# Patient Record
Sex: Female | Born: 1990
Health system: Southern US, Community
[De-identification: ages and names within clinical notes are randomized; demographics above are authoritative.]

## PROBLEM LIST (undated history)

## (undated) DIAGNOSIS — T7840XA Allergy, unspecified, initial encounter: Secondary | ICD-10-CM

## (undated) HISTORY — DX: Allergy, unspecified, initial encounter: T78.40XA

---

## 2014-06-21 ENCOUNTER — Emergency Department (HOSPITAL_COMMUNITY): Payer: No Typology Code available for payment source

## 2014-06-21 ENCOUNTER — Encounter (HOSPITAL_COMMUNITY): Payer: Self-pay | Admitting: Family Medicine

## 2014-06-21 ENCOUNTER — Emergency Department (HOSPITAL_COMMUNITY)
Admission: EM | Admit: 2014-06-21 | Discharge: 2014-06-21 | Disposition: A | Discharge status: Home / Self Care | Payer: No Typology Code available for payment source | Attending: Emergency Medicine | Admitting: Emergency Medicine

## 2014-06-21 DIAGNOSIS — Z3202 Encounter for pregnancy test, result negative: Secondary | ICD-10-CM | POA: Insufficient documentation

## 2014-06-21 DIAGNOSIS — R319 Hematuria, unspecified: Secondary | ICD-10-CM | POA: Diagnosis not present

## 2014-06-21 DIAGNOSIS — R042 Hemoptysis: Secondary | ICD-10-CM | POA: Diagnosis present

## 2014-06-21 DIAGNOSIS — K922 Gastrointestinal hemorrhage, unspecified: Secondary | ICD-10-CM | POA: Insufficient documentation

## 2014-06-21 DIAGNOSIS — J40 Bronchitis, not specified as acute or chronic: Secondary | ICD-10-CM

## 2014-06-21 LAB — URINE MICROSCOPIC-ADD ON

## 2014-06-21 LAB — CBC WITH DIFFERENTIAL/PLATELET
BASOS PCT: 1 % (ref 0–1)
Basophils Absolute: 0.1 10*3/uL (ref 0.0–0.1)
EOS ABS: 0.3 10*3/uL (ref 0.0–0.7)
Eosinophils Relative: 3 % (ref 0–5)
HEMATOCRIT: 42.4 % (ref 36.0–46.0)
HEMOGLOBIN: 14.6 g/dL (ref 12.0–15.0)
Lymphocytes Relative: 19 % (ref 12–46)
Lymphs Abs: 1.9 10*3/uL (ref 0.7–4.0)
MCH: 29.9 pg (ref 26.0–34.0)
MCHC: 34.4 g/dL (ref 30.0–36.0)
MCV: 86.7 fL (ref 78.0–100.0)
MONO ABS: 1.1 10*3/uL — AB (ref 0.1–1.0)
Monocytes Relative: 11 % (ref 3–12)
NEUTROS PCT: 66 % (ref 43–77)
Neutro Abs: 6.8 10*3/uL (ref 1.7–7.7)
Platelets: 265 10*3/uL (ref 150–400)
RBC: 4.89 MIL/uL (ref 3.87–5.11)
RDW: 12.1 % (ref 11.5–15.5)
WBC: 10.1 10*3/uL (ref 4.0–10.5)

## 2014-06-21 LAB — COMPREHENSIVE METABOLIC PANEL
ALBUMIN: 4.6 g/dL (ref 3.5–5.2)
ALT: 11 U/L (ref 0–35)
AST: 24 U/L (ref 0–37)
Alkaline Phosphatase: 66 U/L (ref 39–117)
Anion gap: 8 (ref 5–15)
BILIRUBIN TOTAL: 0.6 mg/dL (ref 0.3–1.2)
BUN: 12 mg/dL (ref 6–23)
CHLORIDE: 103 mmol/L (ref 96–112)
CO2: 24 mmol/L (ref 19–32)
CREATININE: 0.54 mg/dL (ref 0.50–1.10)
Calcium: 9.8 mg/dL (ref 8.4–10.5)
GFR calc Af Amer: >90 mL/min (ref >90)
GFR calc non Af Amer: >90 mL/min (ref >90)
Glucose, Bld: 102 mg/dL — ABNORMAL HIGH (ref 70–99)
Potassium: 3.8 mmol/L (ref 3.5–5.1)
Sodium: 135 mmol/L (ref 135–145)
TOTAL PROTEIN: 8.1 g/dL (ref 6.0–8.3)

## 2014-06-21 LAB — URINALYSIS, ROUTINE W REFLEX MICROSCOPIC
BILIRUBIN URINE: NEGATIVE
Glucose, UA: NEGATIVE mg/dL
HGB URINE DIPSTICK: NEGATIVE
Ketones, ur: NEGATIVE mg/dL
Nitrite: NEGATIVE
Protein, ur: NEGATIVE mg/dL
SPECIFIC GRAVITY, URINE: 1.012 (ref 1.005–1.030)
UROBILINOGEN UA: 0.2 mg/dL (ref 0.0–1.0)
pH: 5.5 (ref 5.0–8.0)

## 2014-06-21 LAB — POC URINE PREG, ED: Preg Test, Ur: NEGATIVE

## 2014-06-21 LAB — POC OCCULT BLOOD, ED: Fecal Occult Bld: POSITIVE — AB

## 2014-06-21 MED ORDER — ACETAMINOPHEN 325 MG PO TABS
650.0000 mg | ORAL_TABLET | Freq: Once | ORAL | Status: AC
  Administered 2014-06-21: 650 mg via ORAL
  Filled 2014-06-21: qty 2

## 2014-06-21 MED ORDER — BENZONATATE 100 MG PO CAPS: 100.0000 mg | ORAL_CAPSULE | Freq: Three times a day (TID) | ORAL | Status: DC

## 2014-06-21 MED ORDER — ALBUTEROL SULFATE HFA 108 (90 BASE) MCG/ACT IN AERS
2.0000 | INHALATION_SPRAY | RESPIRATORY_TRACT | Status: DC | PRN
  Administered 2014-06-21: 2 via RESPIRATORY_TRACT
  Filled 2014-06-21: qty 6.7

## 2014-06-21 NOTE — ED Provider Notes (Signed)
MSE was initiated and I personally evaluated the patient and placed orders (if any) at  10:00 AM on June 21, 2014.  Theresa Garza is a 24 y.o. [place] female who presents to the Emergency Department complaining of cold symptoms including cough, sore throat and nasal congestion for two weeks. History limited due to language barrier, even with interpreter. Additionally patient reports generalized abdominal pain with or without eating and cold feet. Patient reports productive cough with intermittent blood tinged sputum with violent coughing bouts. Patient reports presence of bright red blood. Patient also reports blood in her stools intermittently. Patient reports chills, clear rhinorrhea, bilateral ear pain especially when going outside, and wheezing.   On exam, pt with VSS, clear lung exam, epigastric abd tenderness but no rebound/guarding.   Given the hemoptysis and bloody stools, as well as presence of abd pain, will upgrade and move for eval in pod room.   The patient appears stable so that the remainder of the MSE may be completed by another provider.  BP 121/60 mmHg  Pulse 84  Temp(Src) 99 F (37.2 C) (Oral)  Resp 18  SpO2 100%  LMP 05/21/2014 (Approximate)    Donnita FallsMercedes Strupp [place]prubi-S[place], PA-C 06/21/14 1002  Rolland PorterMark James, MD 06/29/14 1625

## 2014-06-21 NOTE — ED Notes (Signed)
Interpreter used for discharge instruction.

## 2014-06-21 NOTE — ED Notes (Addendum)
Pt c/o sore throat and blood tinged cough x2 weeks with nasal congestion. Pt also reports abdominal pain and bloody stools x2weeks Pt's primary language is [place]. Interpreter used for assessment.

## 2014-06-21 NOTE — ED Provider Notes (Signed)
CSN: [id number]     Arrival date & time 06/21/14  2841 History   First MD Initiated Contact with Patient 06/21/14 [phone]     Chief Complaint  Patient presents with  . Hemoptysis  . Blood In Stools     (Consider location/radiation/quality/duration/timing/severity/associated sxs/prior Treatment) HPI Comments: Patient with no significant past medical history presents with main complaint of cough 2 weeks. Cough is productive of sputum. Patient denies blood in the sputum to me (contradictory to nursing note/initial PA note) but also states that she has bright red blood in her stool and blood in her urine on the tissue paper at times. She compains of having a fever/chills but has not taken her temperature. She otherwise does not have ear pain or nasal congestion. Patient has a sore throat and pain in her chest but only when she is coughing. She has had epigastric pain that is constant for several days without nausea and vomiting. She does report eating a lot of spicy and sour foods. Last period was one month ago and was normal for the patient. She denies dysuria, increased frequency. No treatments prior to arrival. Denies history of hemorrhoids, rectal pain or swelling, pain with defecation. The onset of this condition was acute. The course is constant. Aggravating factors: none. Alleviating factors: none. No sick contacts. No recent travel in last 3 months.    The history is provided by the patient.    History reviewed. No pertinent past medical history. History reviewed. No pertinent past surgical history. No family history on file. History  Substance Use Topics  . Smoking status: Not on file  . Smokeless tobacco: Not on file  . Alcohol Use: Not on file   OB History    No data available     Review of Systems  Constitutional: Positive for fever and chills. Negative for fatigue.  HENT: Positive for sore throat. Negative for congestion, ear pain, rhinorrhea and sinus pressure.   Eyes:  Negative for redness.  Respiratory: Positive for cough and chest tightness (with cough). Negative for shortness of breath and wheezing.   Cardiovascular: Negative for leg swelling.  Gastrointestinal: Positive for abdominal pain (epigastric) and blood in stool. Negative for nausea, vomiting and diarrhea.  Genitourinary: Positive for hematuria. Negative for dysuria.  Musculoskeletal: Negative for myalgias and neck stiffness.  Skin: Negative for rash.  Neurological: Positive for headaches.  Hematological: Negative for adenopathy.    Allergies  Review of patient's allergies indicates no known allergies.  Home Medications   Prior to Admission medications   Not on File   BP 121/60 mmHg  Pulse 84  Temp(Src) 99 F (37.2 C) (Oral)  Resp 18  SpO2 100%  LMP 05/21/2014 (Approximate)   Physical Exam  Constitutional: She appears well-developed and well-nourished.  HENT:  Head: Normocephalic and atraumatic.  Right Ear: Tympanic membrane, external ear and ear canal normal.  Left Ear: Tympanic membrane, external ear and ear canal normal.  Nose: Nose normal. No mucosal edema or rhinorrhea.  Mouth/Throat: Uvula is midline, oropharynx is clear and moist and mucous membranes are normal. Mucous membranes are not dry. No oral lesions. No trismus in the jaw. No uvula swelling. No oropharyngeal exudate, posterior oropharyngeal edema, posterior oropharyngeal erythema or tonsillar abscesses.  Eyes: Conjunctivae are normal. Right eye exhibits no discharge. Left eye exhibits no discharge.  Neck: Normal range of motion. Neck supple.  Cardiovascular: Normal rate, regular rhythm and normal heart sounds.   No murmur heard. Pulmonary/Chest: Effort normal and breath sounds  normal. No respiratory distress. She has no wheezes. She has no rales.  Coughing during exam  Abdominal: Soft. Bowel sounds are normal. She exhibits no distension. There is tenderness (mild, epigastric). There is no rebound and no guarding.   Musculoskeletal: She exhibits no edema or tenderness.  Lymphadenopathy:    She has no cervical adenopathy.  Neurological: She is alert.  Skin: Skin is warm and dry.  Psychiatric: She has a normal mood and affect.  Nursing note and vitals reviewed.   ED Course  Procedures (including critical care time) Labs Review Labs Reviewed  CBC WITH DIFFERENTIAL/PLATELET - Abnormal; Notable for the following:    Monocytes Absolute 1.1 (*)    All other components within normal limits  COMPREHENSIVE METABOLIC PANEL - Abnormal; Notable for the following:    Glucose, Bld 102 (*)    All other components within normal limits  URINALYSIS, ROUTINE W REFLEX MICROSCOPIC - Abnormal; Notable for the following:    Leukocytes, UA TRACE (*)    All other components within normal limits  URINE MICROSCOPIC-ADD ON - Abnormal; Notable for the following:    Squamous Epithelial / LPF FEW (*)    All other components within normal limits  POC URINE PREG, ED  POC OCCULT BLOOD, ED    Imaging Review Dg Chest 2 View  06/21/2014   CLINICAL DATA:  Cough for 2 weeks.  EXAM: CHEST  2 VIEW  COMPARISON:  None.  FINDINGS: The heart size and mediastinal contours are within normal limits. Both lungs are clear. The visualized skeletal structures are unremarkable.  IMPRESSION: Normal chest.   Electronically Signed   By: Francene BoyersJames  Maxwell M.D.   On: 06/21/2014 12:56     EKG Interpretation None       10:53 AM Patient seen and examined. Work-up initiated. Medications ordered.   Vital signs reviewed and are as follows: BP 121/60 mmHg  Pulse 84  Temp(Src) 99 F (37.2 C) (Oral)  Resp 18  SpO2 100%  LMP 05/21/2014 (Approximate)  2:34 PM Labs and x-ray results discussed with patient. Patient's Hemoccult was positive.  Exam remains stable, patient does not have any abdominal tenderness and her abdomen is soft.  Regarding cough, will discharge to home with albuterol inhaler and Tessalon.  Regarding suspected lower GI  bleeding, GI follow-up given. Patient encouraged to increase fiber diet.  The patient was urged to return to the Emergency Department immediately with worsening of current symptoms, worsening abdominal pain, persistent vomiting, increasing amount of blood noted in stools, fever, or any other concerns. The patient verbalized understanding.   BP 99/58 mmHg  Pulse 69  Temp(Src) 99 F (37.2 C) (Oral)  Resp 16  SpO2 100%  LMP 05/21/2014 (Approximate)    MDM   Final diagnoses:  Bronchitis  Lower GI bleeding   Bronchitis: Patient with likely viral bronchitis. No concern for PNA given normal lung exam/x-ray. Antibiotics not indicated. Conservative therapy indicated.    Lower GI bleeding: unclear etiology. Lower GI bleeding suspected given passage of heme positive stool, bright red blood per rectum, presentation not consistent with a large upper GI bleed.  The following differential diagnoses were considered for this patient's lower GI bleed: Hemorrhoids  *Anal fissure *Colonic polyp *Proctitis *IBD *Infectious diarrhea *Diverticulosis *Mesenteric ischemia *Colon cancer *Arteriovenous malformation  None of the following red flags identified or suspected: *Abnormal vital signs *Symptoms suggestive of malignancy such as constitutional symptoms (fever, weight loss), anemia, or change in frequency, caliber or consistency of stools * Family history  of colon cancer  The patient appears reasonably screened and/or stabilized for discharge and I doubt any other medical condition or other emergency medical conditions requiring further screening, evaluation, or treatment in the ED at this time prior to discharge.  Follow-up: Age less than 40, source of bleeding not identified -- unlikely to be colon cancer or other serious etiology. GI referral given for further evaluation, possible sigmoidoscopy.          Renne Crigler, PA-C 06/21/14 1437  Rolland Porter, MD 06/29/14 [phone]

## 2014-06-21 NOTE — Discharge Instructions (Signed)
Please read and follow all provided instructions.  Your diagnoses today include:  1. Bronchitis   2. Lower GI bleeding     Tests performed today include:  Chest x-ray - does not show any pneumonia  Blood counts and electrolytes - normal blood counts, no sign of infection  Urine test - no blood, no infection  Stool test - positive for blood  Vital signs. See below for your results today.   Medications prescribed:   Albuterol inhaler - medication that opens up your airway  Use inhaler as follows: 1-2 puffs with spacer every 4 hours as needed for wheezing, cough, or shortness of breath.    Tessalon Perles - cough suppressant medication  Take any prescribed medications only as directed.  Home care instructions:  Follow any educational materials contained in this packet.  Follow-up instructions: Please follow-up with your primary care provider in the next 3 days for further evaluation of your symptoms and a recheck if you are not feeling better.   Please follow-up with the stomach doctor listed for further evaluation of blood in your stools. You can increase the fiber in your diet to see if this helps.   Return instructions:   Please return to the Emergency Department if you experience worsening symptoms.  Please return with worsening wheezing, shortness of breath, or difficulty breathing.  Return with persistent fever above 101F.   Return if you develop abdominal pain or have a larger amount of blood in your stool.   Please return if you have any other emergent concerns.  Additional Information:  Your vital signs today were: BP 99/58 mmHg   Pulse 69   Temp(Src) 99 F (37.2 C) (Oral)   Resp 16   SpO2 100%   LMP 05/21/2014 (Approximate) If your blood pressure (BP) was elevated above 135/85 this visit, please have this repeated by your doctor within one month. --------------

## 2014-09-14 ENCOUNTER — Emergency Department (HOSPITAL_COMMUNITY)
Admission: EM | Admit: 2014-09-14 | Discharge: 2014-09-14 | Disposition: A | Discharge status: Home / Self Care | Payer: PRIVATE HEALTH INSURANCE | Attending: Emergency Medicine | Admitting: Emergency Medicine

## 2014-09-14 ENCOUNTER — Emergency Department (HOSPITAL_COMMUNITY): Payer: PRIVATE HEALTH INSURANCE

## 2014-09-14 ENCOUNTER — Encounter (HOSPITAL_COMMUNITY): Payer: Self-pay | Admitting: *Deleted

## 2014-09-14 DIAGNOSIS — R05 Cough: Secondary | ICD-10-CM | POA: Insufficient documentation

## 2014-09-14 DIAGNOSIS — K625 Hemorrhage of anus and rectum: Secondary | ICD-10-CM

## 2014-09-14 DIAGNOSIS — R1013 Epigastric pain: Secondary | ICD-10-CM | POA: Insufficient documentation

## 2014-09-14 DIAGNOSIS — Z79899 Other long term (current) drug therapy: Secondary | ICD-10-CM | POA: Diagnosis not present

## 2014-09-14 DIAGNOSIS — R079 Chest pain, unspecified: Secondary | ICD-10-CM | POA: Insufficient documentation

## 2014-09-14 DIAGNOSIS — Z3202 Encounter for pregnancy test, result negative: Secondary | ICD-10-CM | POA: Insufficient documentation

## 2014-09-14 DIAGNOSIS — R053 Chronic cough: Secondary | ICD-10-CM

## 2014-09-14 LAB — URINALYSIS, ROUTINE W REFLEX MICROSCOPIC
Bilirubin Urine: NEGATIVE
Glucose, UA: NEGATIVE mg/dL
Hgb urine dipstick: NEGATIVE
Ketones, ur: NEGATIVE mg/dL
LEUKOCYTES UA: NEGATIVE
Nitrite: NEGATIVE
PH: 7 (ref 5.0–8.0)
Protein, ur: NEGATIVE mg/dL
SPECIFIC GRAVITY, URINE: 1.01 (ref 1.005–1.030)
Urobilinogen, UA: 0.2 mg/dL (ref 0.0–1.0)

## 2014-09-14 LAB — PROTIME-INR
INR: 1.04 (ref 0.00–1.49)
Prothrombin Time: 13.7 seconds (ref 11.6–15.2)

## 2014-09-14 LAB — CBC
HCT: 41.1 % (ref 36.0–46.0)
Hemoglobin: 13.7 g/dL (ref 12.0–15.0)
MCH: 29.5 pg (ref 26.0–34.0)
MCHC: 33.3 g/dL (ref 30.0–36.0)
MCV: 88.4 fL (ref 78.0–100.0)
PLATELETS: 360 10*3/uL (ref 150–400)
RBC: 4.65 MIL/uL (ref 3.87–5.11)
RDW: 12.1 % (ref 11.5–15.5)
WBC: 11.7 10*3/uL — AB (ref 4.0–10.5)

## 2014-09-14 LAB — BASIC METABOLIC PANEL
Anion gap: 10 (ref 5–15)
BUN: 7 mg/dL (ref 6–20)
CHLORIDE: 104 mmol/L (ref 101–111)
CO2: 23 mmol/L (ref 22–32)
Calcium: 9.8 mg/dL (ref 8.9–10.3)
Creatinine, Ser: 0.54 mg/dL (ref 0.44–1.00)
GFR calc non Af Amer: >60 mL/min (ref >60)
GLUCOSE: 87 mg/dL (ref 70–99)
Potassium: 3.9 mmol/L (ref 3.5–5.1)
Sodium: 137 mmol/L (ref 135–145)

## 2014-09-14 LAB — HEPATIC FUNCTION PANEL
ALBUMIN: 4.3 g/dL (ref 3.5–5.0)
ALK PHOS: 64 U/L (ref 38–126)
ALT: 12 U/L — ABNORMAL LOW (ref 14–54)
AST: 18 U/L (ref 15–41)
BILIRUBIN TOTAL: 0.5 mg/dL (ref 0.3–1.2)
Total Protein: 7.8 g/dL (ref 6.5–8.1)

## 2014-09-14 LAB — D-DIMER, QUANTITATIVE: D-Dimer, Quant: <0.27 ug/mL-FEU (ref 0.00–0.48)

## 2014-09-14 LAB — POC URINE PREG, ED: Preg Test, Ur: NEGATIVE

## 2014-09-14 LAB — POC OCCULT BLOOD, ED: FECAL OCCULT BLD: POSITIVE — AB

## 2014-09-14 NOTE — ED Notes (Signed)
MD at bedside. 

## 2014-09-14 NOTE — Discharge Instructions (Signed)
Bloody Stools Bloody stools means there is blood in your poop (stool). It is a sign that there is a problem somewhere in the digestive system. It is important for your doctor to find the cause of your bleeding, so the problem can be treated.  HOME CARE  Only take medicine as told by your doctor.  Eat foods with fiber (prunes, bran cereals).  Drink enough fluids to keep your pee (urine) clear or pale yellow.  Sit in warm water (sitz bath) for 10 to 15 minutes as told by your doctor.  Know how to take your medicines (enemas, suppositories) if advised by your doctor.  Watch for signs that you are getting better or getting worse. GET HELP RIGHT AWAY IF:   You are not getting better.  You start to get better but then get worse again.  You have new problems.  You have severe bleeding from the place where poop comes out (rectum) that does not stop.  You throw up (vomit) blood.  You feel weak or pass out (faint).  You have a fever. MAKE SURE YOU:   Understand these instructions.  Will watch your condition.  Will get help right away if you are not doing well or get worse. Document Released: 04/17/2009 Document Revised: 07/22/2011 Document Reviewed: 09/14/2010 [facility] Patient Information 2015 [place], [place]. This information is not intended to replace advice given to you by your health care provider. Make sure you discuss any questions you have with your health care provider. Hemoptysis Hemoptysis, which means coughing up blood, can be a sign of a minor problem or a serious medical condition. The blood that is coughed up may come from the lungs and airways. Coughed-up blood can also come from bleeding that occurs outside the lungs and airways. Blood can drain into the windpipe during a severe nosebleed or when blood is vomited from the stomach. Because hemoptysis can be a sign of something serious, a medical evaluation is required. For some people with hemoptysis, no definite cause is  ever identified. CAUSES  The most common cause of hemoptysis is bronchitis. Some other common causes include:   A ruptured blood vessel caused by coughing or an infection.   A medical condition that causes damage to the large air passageways (bronchiectasis).   A blood clot in the lungs (pulmonary embolism).   Pneumonia.   Tuberculosis.   Breathing in a small foreign object.   Cancer. For some people with hemoptysis, no definite cause is ever identified.  HOME CARE INSTRUCTIONS  Only take over-the-counter or prescription medicines as directed by your caregiver. Do not use cough suppressants unless your caregiver approves.  If your caregiver prescribes antibiotic medicines, take them as directed. Finish them even if you start to feel better.  Do not smoke. Also avoid secondhand smoke.  Follow up with your caregiver as directed. SEEK IMMEDIATE MEDICAL CARE IF:   You cough up bloody mucus for longer than a week.  You have a blood-producing cough that is severe or getting worse.  You have a blood-producing cough thatcomes and goes over time.  You develop problems with your breathing.   You vomit blood.  You develop bloody or black-colored stools.  You have chest pain.   You develop night sweats.  You feel faint or pass out.   You have a fever or persistent symptoms for more than 2-3 days.  You have a fever and your symptoms suddenly get worse. MAKE SURE YOU:  Understand these instructions.  Will watch  your condition.  Will get help right away if you are not doing well or get worse. Document Released: 07/08/2001 Document Revised: 04/15/2012 Document Reviewed: 02/14/2012 [facility] Patient Information 2015 [place], [place]. This information is not intended to replace advice given to you by your health care provider. Make sure you discuss any questions you have with your health care provider. Chest Pain (Nonspecific) It is often hard to give a specific  diagnosis for the cause of chest pain. There is always a chance that your pain could be related to something serious, such as a heart attack or a blood clot in the lungs. You need to follow up with your health care provider for further evaluation. CAUSES   Heartburn.  Pneumonia or bronchitis.  Anxiety or stress.  Inflammation around your heart (pericarditis) or lung (pleuritis or pleurisy).  A blood clot in the lung.  A collapsed lung (pneumothorax). It can develop suddenly on its own (spontaneous pneumothorax) or from trauma to the chest.  Shingles infection (herpes zoster virus). The chest wall is composed of bones, muscles, and cartilage. Any of these can be the source of the pain.  The bones can be bruised by injury.  The muscles or cartilage can be strained by coughing or overwork.  The cartilage can be affected by inflammation and become sore (costochondritis). DIAGNOSIS  Lab tests or other studies may be needed to find the cause of your pain. Your health care provider may have you take a test called an ambulatory electrocardiogram (ECG). An ECG records your heartbeat patterns over a 24-hour period. You may also have other tests, such as:  Transthoracic echocardiogram (TTE). During echocardiography, sound waves are used to evaluate how blood flows through your heart.  Transesophageal echocardiogram (TEE).  Cardiac monitoring. This allows your health care provider to monitor your heart rate and rhythm in real time.  Holter monitor. This is a portable device that records your heartbeat and can help diagnose heart arrhythmias. It allows your health care provider to track your heart activity for several days, if needed.  Stress tests by exercise or by giving medicine that makes the heart beat faster. TREATMENT   Treatment depends on what may be causing your chest pain. Treatment may include:  Acid blockers for heartburn.  Anti-inflammatory medicine.  Pain medicine for  inflammatory conditions.  Antibiotics if an infection is present.  You may be advised to change lifestyle habits. This includes stopping smoking and avoiding alcohol, caffeine, and chocolate.  You may be advised to keep your head raised (elevated) when sleeping. This reduces the chance of acid going backward from your stomach into your esophagus. Most of the time, nonspecific chest pain will improve within 2-3 days with rest and mild pain medicine.  HOME CARE INSTRUCTIONS   If antibiotics were prescribed, take them as directed. Finish them even if you start to feel better.  For the next few days, avoid physical activities that bring on chest pain. Continue physical activities as directed.  Do not use any tobacco products, including cigarettes, chewing tobacco, or electronic cigarettes.  Avoid drinking alcohol.  Only take medicine as directed by your health care provider.  Follow your health care provider's suggestions for further testing if your chest pain does not go away.  Keep any follow-up appointments you made. If you do not go to an appointment, you could develop lasting (chronic) problems with pain. If there is any problem keeping an appointment, call to reschedule. SEEK MEDICAL CARE IF:   Your chest  pain does not go away, even after treatment.  You have a rash with blisters on your chest.  You have a fever. SEEK IMMEDIATE MEDICAL CARE IF:   You have increased chest pain or pain that spreads to your arm, neck, jaw, back, or abdomen.  You have shortness of breath.  You have an increasing cough, or you cough up blood.  You have severe back or abdominal pain.  You feel nauseous or vomit.  You have severe weakness.  You faint.  You have chills. This is an emergency. Do not wait to see if the pain will go away. Get medical help at once. Call your local emergency services (911 in [place] Do not drive yourself to the hospital. MAKE SURE YOU:   Understand these  instructions.  Will watch your condition.  Will get help right away if you are not doing well or get worse. Document Released: 02/06/2005 Document Revised: 05/04/2013 Document Reviewed: 12/03/2007 [facility] Patient Information 2015 [place], [place]. This information is not intended to replace advice given to you by your health care provider. Make sure you discuss any questions you have with your health care provider.

## 2014-09-14 NOTE — ED Notes (Signed)
The pt is here for several complaints.  Swelling on the outside of her throat stuffy nose. Nausea.  She also has had some rectal  Problem  Maybe blood.  She wants a colonoscopy    There is a language barrier.  lmpmaybe last month.  She has been seen here for other things

## 2014-09-14 NOTE — ED Notes (Signed)
Patient transported to X-ray 

## 2014-09-14 NOTE — ED Provider Notes (Signed)
History is obtained through medical interpreter using [place] language line. Patient complains of difficulty breathing through her nose and nose stuffiness intermittently for several weeks. No other complaint at present. She is alert no distress lungs clear auscultation heart regular rate and rhythm abdomen nondistended nontender.  Doug SouSam Kyndall Amero, MD 09/14/14 2052

## 2014-09-14 NOTE — ED Provider Notes (Signed)
CSN: [id number]     Arrival date & time 09/14/14  1457 History   First MD Initiated Contact with Patient 09/14/14 1632     Chief Complaint  Patient presents with  . URI  . Rectal Bleeding     (Consider location/radiation/quality/duration/timing/severity/associated sxs/prior Treatment) HPI   This is a 24 year old female, originally from [place], 13 [place] status [place] into [place], presenting today with a host of complaints, including cough, hemoptysis, hematochezia. This is been going on for 4 months. She has had mild, intermittent, blood from her rectum. She is not taking any new medicines for this. She also endorses epigastric abdominal pain, mild, nonradiating, without nausea, vomiting. Positive for months of intermittent left-sided sharp chest pain, which she does not endorse at this time. She was seen in this department 3 months ago for this, recommended that she follow up with primary care physician, gastroenterology. She is neglected to do both of these things.  History reviewed. No pertinent past medical history. History reviewed. No pertinent past surgical history. No family history on file. History  Substance Use Topics  . Smoking status: Never Smoker   . Smokeless tobacco: Not on file  . Alcohol Use: No   OB History    No data available     Review of Systems  Constitutional: Negative for fever and chills.  HENT: Negative for facial swelling.   Eyes: Negative for photophobia and pain.  Respiratory: Negative for cough and shortness of breath.   Cardiovascular: Positive for chest pain. Negative for leg swelling.  Gastrointestinal: Positive for abdominal pain and blood in stool. Negative for nausea and vomiting.  Genitourinary: Positive for hematuria. Negative for dysuria.  Musculoskeletal: Negative for arthralgias.  Skin: Negative for rash and wound.  Neurological: Negative for seizures.  Hematological: Negative for adenopathy.      Allergies   Review of patient's allergies indicates no known allergies.  Home Medications   Prior to Admission medications   Medication Sig Start Date End Date Taking? Authorizing Provider  Ascorbic Acid (VITAMIN C PO) Take 1 tablet by mouth daily.   Yes Historical Provider, MD  benzonatate (TESSALON) 100 MG capsule Take 1 capsule (100 mg total) by mouth every 8 (eight) hours. 06/21/14   Renne CriglerJoshua Geiple, PA-C   BP 97/62 mmHg  Pulse 78  Temp(Src) 98.7 F (37.1 C)  Resp 13  Wt 111 lb (50.349 kg)  SpO2 100%  LMP 08/15/2014 Physical Exam  Constitutional: She is oriented to person, place, and time. She appears well-developed and well-nourished. No distress.  HENT:  Head: Normocephalic and atraumatic.  Mouth/Throat: No oropharyngeal exudate.  Eyes: Conjunctivae are normal. Pupils are equal, round, and reactive to light. No scleral icterus.  Neck: Normal range of motion. No tracheal deviation present. No thyromegaly present.  Cardiovascular: Normal rate, regular rhythm and normal heart sounds.  Exam reveals no gallop and no friction rub.   No murmur heard. Pulmonary/Chest: Effort normal and breath sounds normal. No stridor. No respiratory distress. She has no wheezes. She has no rales. She exhibits no tenderness.  Abdominal: Soft. She exhibits no distension and no mass. There is no tenderness. There is no rebound and no guarding.  Genitourinary: Rectal exam shows no external hemorrhoid, no internal hemorrhoid, no fissure, no mass, no tenderness and anal tone normal. Guaiac positive stool.  Musculoskeletal: Normal range of motion. She exhibits no edema.  Neurological: She is alert and oriented to person, place, and time.  Skin: Skin is warm and dry. She  is not diaphoretic.    ED Course  Procedures (including critical care time) Labs Review Labs Reviewed  CBC - Abnormal; Notable for the following:    WBC 11.7 (*)    All other components within normal limits  HEPATIC FUNCTION PANEL - Abnormal;  Notable for the following:    ALT 12 (*)    Bilirubin, Direct <0.1 (*)    All other components within normal limits  POC OCCULT BLOOD, ED - Abnormal; Notable for the following:    Fecal Occult Bld POSITIVE (*)    All other components within normal limits  BASIC METABOLIC PANEL  PROTIME-INR  D-DIMER, QUANTITATIVE  URINALYSIS, ROUTINE W REFLEX MICROSCOPIC  POC URINE PREG, ED    Imaging Review Dg Chest 2 View  09/14/2014   CLINICAL DATA:  Upper respiratory infection, rectal bleeding  EXAM: CHEST  2 VIEW  COMPARISON:  06/21/2014  FINDINGS: Cardiomediastinal silhouette is stable. No acute infiltrate or pleural effusion. No pulmonary edema. Bony thorax is unremarkable.  IMPRESSION: No active cardiopulmonary disease.   Electronically Signed   By: Natasha MeadLiviu  Pop M.D.   On: 09/14/2014 17:21     EKG Interpretation   Date/Time:  Wednesday Sep 14 2014 17:32:05 EDT Ventricular Rate:  79 PR Interval:  135 QRS Duration: 74 QT Interval:  381 QTC Calculation: 437 R Axis:   76 Text Interpretation:  Sinus rhythm Early repolarization (normal variant)  No old tracing to compare Confirmed by Ethelda ChickJACUBOWITZ  MD, SAM [phone]([id number]) on  09/14/2014 5:40:56 PM      MDM   Final diagnoses:  Rectal bleeding  Chronic cough    This is a 24 year old female, originally from [place], 13 [place] status [place] immigration into [place], presenting today with a host of complaints, including cough, hemoptysis, hematochezia. This is been going on for 4 months. She has had mild, intermittent, blood from her rectum. She is not taking any new medicines for this. She also endorses epigastric abdominal pain, mild, nonradiating, without nausea, vomiting. Positive for months of intermittent left-sided sharp chest pain, which she does not endorse at this time. She was seen in this department 3 months ago for this, recommended that she follow up with primary care physician, gastroenterology. She is neglected to do both of these  things.  On my examination, cardiovascular, respiratory exams are within normal limits. Negative for conjunctival pallor. Capillary refill is brisk. Negative for focal neurologic deficits. Negative for signs or symptoms of anemia.  D-dimer is undetectable. I do not suspect pulmonary meals and at this time. EKG is without ischemic changes. Chest x-ray is without acute abnormalities. Hemoglobin is stable.  Blatchford risk stratification pool has score of 1.  The patient is suffering from gastrointestinal bleed, she is at very low risk of short-term adverse outcomes.  Patient remains stable at this time.  Pt stable for discharge, FU.  All questions answered.  Return precautions given.  I have discussed case and care has been guided by my attending physician, Dr. Ethelda ChickJacubowitz.  Loma BostonStirling Grey Rakestraw, MD 09/14/14 2049  Doug SouSam Jacubowitz, MD 09/15/14 [id number]

## 2014-09-19 ENCOUNTER — Ambulatory Visit: Payer: PRIVATE HEALTH INSURANCE

## 2014-10-05 ENCOUNTER — Ambulatory Visit (INDEPENDENT_AMBULATORY_CARE_PROVIDER_SITE_OTHER): Payer: PRIVATE HEALTH INSURANCE | Admitting: Family Medicine

## 2014-10-05 VITALS — BP 100/68 | HR 78 | Temp 98.0°F | Resp 18 | Ht <= 58 in | Wt 108.8 lb

## 2014-10-05 DIAGNOSIS — K219 Gastro-esophageal reflux disease without esophagitis: Secondary | ICD-10-CM | POA: Diagnosis not present

## 2014-10-05 DIAGNOSIS — R05 Cough: Secondary | ICD-10-CM

## 2014-10-05 DIAGNOSIS — R059 Cough, unspecified: Secondary | ICD-10-CM

## 2014-10-05 DIAGNOSIS — K5900 Constipation, unspecified: Secondary | ICD-10-CM | POA: Diagnosis not present

## 2014-10-05 DIAGNOSIS — K649 Unspecified hemorrhoids: Secondary | ICD-10-CM | POA: Diagnosis not present

## 2014-10-05 MED ORDER — POLYETHYLENE GLYCOL 3350 17 GM/SCOOP PO POWD: 17.0000 g | Freq: Two times a day (BID) | ORAL | Status: DC | PRN

## 2014-10-05 MED ORDER — OMEPRAZOLE 40 MG PO CPDR: 40.0000 mg | DELAYED_RELEASE_CAPSULE | Freq: Every day | ORAL | Status: DC

## 2014-10-05 NOTE — Progress Notes (Signed)
Chief Complaint:  Chief Complaint  Patient presents with  . Follow-up    post hospital visit for bloody stool, and cough  . Chest Pain    HPI: Theresa Garza is a 24 y.o. female who is here for recheck: 1. Cough, excessive cough makes her have some hemoptysis.Chest xray was negative for any active cardiopulm diseases including TB.  She is currently on zpack for this. Denies TB like sxs ie weight loss that is untintentional, she did take a weight loss supplement about 1 month ago. BUt has since stopped. Deneis palpitations or dizziness or SOB 2. She was seen in ER for all these sxs including BRBPR, when she wipes. She has no family hx of colon cancer. She eats spicy foods. She has constipation, She has a history of hemorrhoids. IN ER she had + hemocult.  She has not seen GI 3. She has GERD like sxs, chest pain which is midepigastric and goes up her esophagus, she eats spicy foods and lots of sour substances. She doe snto drink caffeine.    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 No pertinent past medical history. History reviewed. No pertinent past surgical history. History   Social History  . Marital Status: Single    Spouse Name: N/A  . Number of Children: N/A  . Years of Education: N/A   Social History Main Topics  . Smoking status: Never Smoker   . Smokeless tobacco: Not on file  . Alcohol Use: No  . Drug Use: Not on file  . Sexual Activity: Not on file   Other Topics Concern  . None   Social History Narrative   History reviewed. No pertinent family history. No Known Allergies Prior to Admission medications   Medication Sig Start Date End Date Taking? Authorizing Provider  azithromycin (ZITHROMAX) 250 MG tablet Take by mouth daily.   Yes Historical Provider, MD  Ascorbic Acid (VITAMIN C PO) Take 1 tablet by  mouth daily.    Historical Provider, MD  benzonatate (TESSALON) 100 MG capsule Take 1 capsule (100 mg total) by mouth every 8 (eight) hours. Patient not taking: Reported on 10/05/2014 06/21/14   Renne Crigler, PA-C     ROS: The patient denies fevers, chills, night sweats, unintentional weight loss, palpitations, wheezing, dyspnea on exertion, nausea, vomiting, abdominal pain, dysuria, hematuria, melena, numbness, weakness, or tingling.   All other systems have been reviewed and were otherwise negative with the exception of those mentioned in the HPI and as above.    PHYSICAL EXAM: Filed Vitals:   10/05/14 1746  BP: 100/68  Pulse: 78  Temp: 98 F (36.7 C)  Resp: 18   Filed Vitals:   10/05/14 1746  Height:  (1.473 m)  Weight: 108 lb 12.8 oz (49.351 kg)   Body mass index is 22.75 kg/(m^2).  General: Alert, no acute distress HEENT:  Normocephalic, atraumatic, oropharynx patent. EOMI, PERRLA, TM normal, neg exudates. No thryoid megaly Cardiovascular:  Regular rate and rhythm, no rubs murmurs or gallops.  No Carotid bruits, radial pulse intact. No pedal edema.  Respiratory: Clear to auscultation bilaterally.  No wheezes, rales, or rhonchi.  No cyanosis, no use of accessory musculature GI: No organomegaly, abdomen is soft and non-tender, positive bowel sounds.  No masses. Skin: No rashes. Neurologic: Facial musculature  symmetric. CN 2-12 grossly normal Psychiatric: Patient is appropriate throughout our interaction. Lymphatic: No cervical lymphadenopathy Musculoskeletal: Gait intact. + hemorrhoid external, non internal    LABS: Results for orders placed or performed during the hospital encounter of 09/14/14  Basic metabolic panel  Result Value Ref Range   Sodium 137 135 - 145 mmol/L   Potassium 3.9 3.5 - 5.1 mmol/L   Chloride 104 101 - 111 mmol/L   CO2 23 22 - 32 mmol/L   Glucose, Bld 87 70 - 99 mg/dL   BUN 7 6 - 20 mg/dL   Creatinine, Ser 1.610.54 0.44 - 1.00 mg/dL   Calcium  9.8 8.9 - 09.610.3 mg/dL   GFR calc non Af Amer >60 >60 mL/min   GFR calc Af Amer >60 >60 mL/min   Anion gap 10 5 - 15  CBC  Result Value Ref Range   WBC 11.7 (H) 4.0 - 10.5 K/uL   RBC 4.65 3.87 - 5.11 MIL/uL   Hemoglobin 13.7 12.0 - 15.0 g/dL   HCT 04.541.1 40.936.0 - 81.146.0 %   MCV 88.4 78.0 - 100.0 fL   MCH 29.5 26.0 - 34.0 pg   MCHC 33.3 30.0 - 36.0 g/dL   RDW 91.412.1 78.211.5 - 95.615.5 %   Platelets 360 150 - 400 K/uL  Urinalysis, Routine w reflex microscopic  Result Value Ref Range   Color, Urine YELLOW YELLOW   APPearance CLEAR CLEAR   Specific Gravity, Urine 1.010 1.005 - 1.030   pH 7.0 5.0 - 8.0   Glucose, UA NEGATIVE NEGATIVE mg/dL   Hgb urine dipstick NEGATIVE NEGATIVE   Bilirubin Urine NEGATIVE NEGATIVE   Ketones, ur NEGATIVE NEGATIVE mg/dL   Protein, ur NEGATIVE NEGATIVE mg/dL   Urobilinogen, UA 0.2 0.0 - 1.0 mg/dL   Nitrite NEGATIVE NEGATIVE   Leukocytes, UA NEGATIVE NEGATIVE  Protime-INR  Result Value Ref Range   Prothrombin Time 13.7 11.6 - 15.2 seconds   INR 1.04 0.00 - 1.49  Hepatic function panel  Result Value Ref Range   Total Protein 7.8 6.5 - 8.1 g/dL   Albumin 4.3 3.5 - 5.0 g/dL   AST 18 15 - 41 U/L   ALT 12 (L) 14 - 54 U/L   Alkaline Phosphatase 64 38 - 126 U/L   Total Bilirubin 0.5 0.3 - 1.2 mg/dL   Bilirubin, Direct <2.1<0.1 (L) 0.1 - 0.5 mg/dL   Indirect Bilirubin NOT CALCULATED 0.3 - 0.9 mg/dL  D-dimer, quantitative  Result Value Ref Range   D-Dimer, Quant <0.27 0.00 - 0.48 ug/mL-FEU  POC occult blood, ED  Result Value Ref Range   Fecal Occult Bld POSITIVE (A) NEGATIVE  POC urine preg, ED ([facility])  Result Value Ref Range   Preg Test, Ur NEGATIVE NEGATIVE     EKG/XRAY:   [facility]   ASSESSMENT/PLAN: Encounter Diagnoses  Name Primary?  . Gastroesophageal reflux disease, esophagitis presence not specified Yes  . Hemorrhoids, unspecified hemorrhoid type   . Cough   . Constipation, unspecified constipation type    24  y/o [place] female who is over here to learn English and go to school, she has a PMH of GERD like sxs and constipation  Gave detailed instructions on GERD prescautions, protonix rx, decrease to 20 mg daily from 40  When sxs lessen Cont with z pack that another urgent care had given her for URI sxs which I think is either allergies or GERD related OPTc miralax and also  nasacort, if her constipation resolveds consider Zyrtec if allergie spersists IF blood cont even after miralax then need GI referral She is ok with plan today.  Fu as needed  More than 30 min face to face time spent with patient    Gross sideeffects, risk and benefits, and alternatives of medications d/w patient. Patient is aware that all medications have potential sideeffects and we are unable to predict every sideeffect or drug-drug interaction that may occur.  Hamilton Capri PHUONG, DO 10/07/2014 12:46 PM

## 2014-10-05 NOTE — Patient Instructions (Signed)
Food Choices for Gastroesophageal Reflux Disease When you have gastroesophageal reflux disease (GERD), the foods you eat and your eating habits are very important. Choosing the right foods can help ease the discomfort of GERD. WHAT GENERAL GUIDELINES DO I NEED TO FOLLOW?  Choose fruits, vegetables, whole grains, low-fat dairy products, and low-fat meat, fish, and poultry.  Limit fats such as oils, salad dressings, butter, nuts, and avocado.  Keep a food diary to identify foods that cause symptoms.  Avoid foods that cause reflux. These may be different for different people.  Eat frequent small meals instead of three large meals each day.  Eat your meals slowly, in a relaxed setting.  Limit fried foods.  Cook foods using methods other than frying.  Avoid drinking alcohol.  Avoid drinking large amounts of liquids with your meals.  Avoid bending over or lying down until 2-3 hours after eating. WHAT FOODS ARE NOT RECOMMENDED? The following are some foods and drinks that may worsen your symptoms: Vegetables Tomatoes. Tomato juice. Tomato and spaghetti sauce. Chili peppers. Onion and garlic. Horseradish. Fruits Oranges, grapefruit, and lemon (fruit and juice). Meats High-fat meats, fish, and poultry. This includes hot dogs, ribs, ham, sausage, salami, and bacon. Dairy Whole milk and chocolate milk. Sour cream. Cream. Butter. Ice cream. Cream cheese.  Beverages Coffee and tea, with or without caffeine. Carbonated beverages or energy drinks. Condiments Hot sauce. Barbecue sauce.  Sweets/Desserts Chocolate and cocoa. Donuts. Peppermint and spearmint. Fats and Oils High-fat foods, including French fries and potato chips. Other Vinegar. Strong spices, [place] as black pepper, white pepper, red pepper, cayenne, curry powder, cloves, ginger, and chili powder. The items listed above may not be a complete list of foods and beverages to avoid. Contact your dietitian for more  information. Document Released: 04/29/2005 Document Revised: 05/04/2013 Document Reviewed: 03/03/2013 [facility] Patient Information 2015 [place], [place]. This information is not intended to replace advice given to you by your health care provider. Make sure you discuss any questions you have with your health care provider. B?nh Tro Ng??c D? Dy Th?c Qu?n, Ng??i L?n (Gastroesophageal Reflux Diseaes, Adult) B?nh tro ng??c d? dy th?c qu?n (GERD) x?y ra khi axit t? d? dy tro ln th?c qu?n. Khi axit ti?p xc v?i th?c qu?n, axit gy ra ?au (vim) trong th?c qu?n. Theo th?i gian, GERD c th? t?o ra cc l? nh? (cc v?t lot) ? nim m?c th?c qu?n.  NGUYN NHN  Tr?ng l??ng c? th? t?ng. ?i?u ny t?o p l?c ln d? dy, lm t?ng axit t? d? dy [place] th?c qu?n.  Ht thu?c l. Ht thu?c l lm t?ng s?n sinh axit trong d? dy.  U?ng r??u. ?y l nguyn nhn lm gi?m p l?c trong c? th?t th?c qu?n d??i (van ho?c vng c? gi?a th?c qu?n v d? dy), cho php axit t? d? dy [place] th?c qu?n.  ?n t?i mu?n v b?ng no. Tnh tr?ng ny lm t?ng p l?c c?ng nh? t?ng s?n sinh axit trong d? dy.  D? t?t c? th?t th?c qu?n d??i. ?i khi khng tm th?y nguyn nhn. TRI?U CH?NG  ?au rt ? ph?n d??i gi?a ng?c pha sau x??ng ?c v ? khu v?c gi?a d? dy. Hi?n t??ng ny c th? x?y ra hai l?n m?t tu?n ho?c th??ng xuyn h?n.  Kh nu?t.  ?au h?ng.  Ho khan.  Cc tri?u ch?ng gi?ng hen suy?n, bao g?m t?c ng?c,kh th? ho?c th? kh kh. CH?N ?ON Chuyn gia ch?m [place] s?c kh?e c th?  ch?n ?on GERD d?a trn cc tri?u ch?ng c?a b?n. Trong m?t s? tr??ng h?p, ch?p X quang v cc xt nghi?m khc c th? ???c ti?n hnh ?? ki?m tra cc bi?n ch?ng ho?c tnh tr?ng c?a d? dy v th?c qu?n. ?I?U TR? Chuyn gia ch?m [place] s?c kh?e c th? khuy?n ngh? dng thu?c khng c?n k ??n ho?c thu?c c?n k ??n ?? gip gi?m s?n sinh axit. Hy h?i chuyn gia ch?m [place] s?c kh?e c?a b?n tr??c khi b?t ??u ho?c dng thm b?t k? lo?i thu?c m?i no. H??NG D?N  CH?M [place] T?I NH  Thay ??i cc y?u t? m b?n c th? ki?m sot ???c. H?i chuyn gia ch?m [place] s?c kh?e ?? ???c h??ng d?n v? vi?c gi?m cn, b? thu?c l v s? d?ng r??u.  [place] cc lo?i th?c ph?m v ?? u?ng lm cho cc tri?u ch?ng t?i t? h?n, ch?ng h?n nh?:  ?? u?ng c caffeine ho?c r??u.  S c la.  B?c h ho?c v? b?c h.  T?i v hnh ty.  Th?c ?n [place].  Tri cy h? cam, ch?ng h?n nh? cam, chanh hay chanh ty.  Cc th?c ?n c c chua, ch?ng h?n nh? n??c x?t, ?t, salsa (n??c x?t [place]) v bnh pizza.  Cc lo?i th?c ?n chin xo v nhi?u ch?t bo.  Trnh n?m xu?ng ng? 3 ti?ng tr??c gi? ?i ng? ho?c tr??c khi c m?t gi?c ng? ng?n.  ?n nh?ng b?a ?n nh?, th??ng xuyn h?n thay v cc b?a ?n l?n.  M?c qu?n o r?ng. Khng ?eo b?t c? th? g ch?t quanh th?t l?ng gy p l?c ln d? dy.  Nng ??u gi??ng cao ln t? 6 ??n 8 inch b?ng cc kh?i g? ?? gip b?n ng?. S? d?ng thm g?i s? khng c tc d?ng.  Ch? s? d?ng thu?c khng c?n k ??n ho?c thu?c c?n k ??n ?? gi?m ?au, gi?m c?m gic kh ch?u ho?c h? s?t theo ch? d?n c?a chuyn gia ch?m [place] s?c kh?e c?a b?n.  Khng dng thu?c atpirin, ibuprofen ho?c cc thu?c ch?ng vim khng c steroid (NSAID) khc. HY NGAY L?P T?C ?I KHM N?U:  B?n b? ?au ? cnh tay, c?, hm, r?ng ho?c l?ng.  Hi?n t??ng ?au t?ng ln ho?c thay ??i theo c??ng ?? ho?c th?i gian.  B?n b? bu?n nn, nn ho?c ?? m? hi (tot m? hi).  B?n b? kh th? ho?c ng?t x?u.  Ch?t nn c mu xanh l cy, vng, ?en ho?c trng gi?ng nh? b c ph ho?c mu.  Phn c mu ??, ?? nh? mu ho?c ?en. Nh?ng tri?u ch?ng ny c th? l d?u hi?u c?a cc v?n ?? khc, ch?ng h?n nh? b?nh tim, ch?y mu d? dy ho?c ch?y mu th?c qu?n. ??M B?O B?N:  Hi?u cc h??ng d?n ny.  S? theo di tnh tr?ng c?a mnh.  S? yu c?u tr? gip ngay l?p t?c n?u b?n c?m th?y khng ?? ho?c tnh tr?ng tr?m tr?ng h?n. Document Released: 02/06/2005 Document Revised: 12/30/2012 [facility] Patient Information 2015 [place],  [place]. This information is not intended to replace advice given to you by your health care provider. Make sure you discuss any questions you have with your health care provider. B?nh Tr? (Hemorrhoids) Tr? l cc t?nh m?ch b? s?ng xung quanh tr?c trng ho?c h?u mn. C hai lo?i tr?:  Tr? n?i. Lo?i ny xu?t hi?n trong cc t?nh m?ch ngay bn trong tr?c trng. Chng c th? ch?c xuyn qua bn  ngoi v b? kch thch v ?au.  Tr? ngo?i. Lo?i ny xu?t hi?n ? cc t?nh m?ch bn ngoi h?u mn v c th? c?m th?y s?ng ?au ho?c m?t c?c s?ng c?ng g?n h?u mn. NGUYN NHN  [place].  Bo ph.  To bn ho?c tiu ch?y.  R?n ?? ??i ti?n.  Ng?i lu trong nh v? sinh.  Nng v?t n?ng ho?c ho?t ??ng khc khi?n b?n c?ng th?ng.  Giao h?p qua ???ng h?u mn. TRI?U CH?NG  ?au.  Ng?a ho?c kch thch h?u mn.  Ch?y mu tr?c trng.  R r? phn.  S?ng h?u mn.  M?t ho?c nhi?u c?c xung quanh h?u mn. CH?N ?ON Chuyn gia ch?m [place] s?c kh?e c th? ch?n ?on b?nh tr? b?ng cch khm b?ng m?t th??ng. Cc ki?m tra v xt nghi?m khc c th? ???c th?c hi?n bao g?m:  Ki?m tra khu v?c tr?c trng b?ng tay ?eo g?ng (ki?m tra tr?c trng b?ng ngn tay).  Ki?m tra ?ng h?u mn b?ng cch s? d?ng m?t ?ng nh? (?ng soi).  Xt nghi?m mu n?u b?n b? m?t m?t l??ng mu ?ng k?.  M?t ki?m tra ?? xem bn trong ??i trng (soi ??i trng sigma ho??c soi ??i trng). ?I?U TR? H?u h?t b?nh tr? c th? ???c ?i?u tr? t?i nh. Tuy nhin, n?u cc tri?u ch?ng c v? khng kh h?n ho?c n?u b?n b? ch?y mu tr?c trng nhi?u, chuyn gia ch?m [place] s?c kh?e c th? th?c hi?n m?t th? thu?t ?? gip lm cho tr? nh? h?n ho?c lo?i b? chng hon ton. Cc ph??ng php ?i?u tr? c th? bao g?m:  ??t m?t b?ng cao su t?i chn tr? ?? c?t ??t tu?n hon mu (th?t ??ng m?ch b?ng b?ng cao su).  Tim ha ch?t ?? thu nh? tr? (li?u php x? ho).  S? d?ng m?t d?ng c? ?? ??t tr? (li?u php nh sng h?ng ngo?i).  Ph?u thu?t lo?i b? tr? (th? thu?t c?t tr?).  K?p  tr? ?? ch?n dng mu ??n m (k?p tr?). H??NG D?N CH?M [place] T?I NH  ?n th?c ?n c nhi?u ch?t x? nh? ng? c?c, ??u, cc lo?i h?t, tri cy v rau. Hy h?i bc s? v? vi?c dng cc s?n ph?m c b? sung ch?t x? (b? sung ch?t x?).  U?ng nhi?u n??c h?n. U?ng ?? n??c v dung d?ch ?? n??c ti?u trong ho?c c mu vng nh?t.  T?p th? d?c th??ng xuyn.  ?i v? sinh khi b?n c nhu c?u. Khng ch?.  Trnh r?n khi ?i ??i ti?n.  Gi? vng h?u mn kh v s?ch s?. S? d?ng gi?y v? sinh ??t ho?c kh?n gi?y ?m sau khi ??i ti?n.  Thu?c kem v thu?c ??n c th? ???c s? d?ng ho?c p d?ng theo ch? d?n.  Ch? s? d?ng thu?c khng c?n k toa ho?c thu?c c?n k toa theo ch? d?n c?a chuyn gia ch?m  s?c kh?e.  T?m ng?i trong n??c ?m kho?ng 15-20 pht, 3-4 l?n m?t ngy ?? gi?m ?au v kh ch?u.  Ch??m ? l?nh [place] ch? tr? n?u n nh?y c?m ?au v s?ng ln. Vi?c s? d?ng cc gi ch??m ? gi?a cc l?n t?m ng?i c th? h?u ch.  Cho ? l?nh [place] ti nh?a.  ??t kh?n t?m gi?a da v ti.  ?? ? l?nh kho?ng 15-20 pht, 3-4 l?n m?t ngy.  Khng s? d?ng g?i hnh bnh rn ho?c ng?i lu trn b?n c?u. ?i?u ny lm t?ng tch t?  mu v ?au. HY ?I KHM N?U:  B?n b? ?au v s?ng t?ng ln khng ki?m sot ???c b?ng ?i?u tr? ho?c thu?c.  B?n b? ch?y mu khng ki?m sot ???c.  B?n g?p kh kh?n ho?c khng th? ?i ??i ti?n.  B?n b? ?au ho?c vim nhi?m bn ngoi khu v?c tr?Marland Kitchen ??M B?O B?N:  Hi?u cc h??ng d?n ny.  S? theo di tnh tr?ng c?a mnh.  S? yu c?u tr? gip ngay l?p t?c n?u b?n c?m th?y khng ?? ho?c tnh tr?ng tr?m tr?ng h?n. Document Released: 02/06/2005 Document Revised: 12/30/2012 [facility] Patient Information 2015 [place], [place]. This information is not intended to replace advice given to you by your health care provider. Make sure you discuss any questions you have with your health care provider. To bn (Constipation) To bn l khi m?t ng??i ?i ??i ti?n t h?n ba l?n trong m?t tu?n, ??i ti?n kh kh?n, ho?c ??i ti?n ra  phn kh, c?ng, ho?c to h?n bnh th??ng. Khi tu?i cng cao th cng d? b? to bn. N?u qu v? c? g?ng ch?a to bn b?ng cc lo?i thu?c gip qu v? ??i ti?n ???c (thu?c nhu?n trng), b?nh c th? n?ng thm. S? d?ng thu?c nhu?n trng trong th?i gian di c th? lm cho cc c? c?a ru?t gi y?u ?i. Ch? ?? ?n thi?u ch?t x?, khng u?ng ?? n??c, v dng m?t s? lo?i thu?c nh?t ??nh c th? lm to bo n?ng thm.  NGUYN NHN.   M?t s? lo?i thu?c nh?t ??nh nh? thu?c ch?ng tr?m c?m, thu?c gi?m ?au, thu?c c b? sung s?t, thu?c trung ha axit d?ch v?, v thu?c l?i ti?u.  M?t s? b?nh l nh?t ??nh nh? ti?u ???ng, h?i ch?ng ru?t kch thch (IBS), b?nh c?a tuy?n gip, ho?c tr?m c?m.  Khng u?ng ?? n??c.  Khng ?n ?? th?c ?n giu ch?t x?.  C?ng th?ng ho?c do ?i l?i.  t ho?t ??ng thn th? ho?c th? d?c.  Nh?n ?i ??i ti?n.  S? d?ng qu nhi?u thu?c nhu?n trng. D?U HI?U V TRI?U CH?NG   ?i ??i ti?n t h?n ba l?n m?i tu?n.  Ph?i r?n m?nh ?? ??i ti?n.  ??i ti?n ra phn c?ng, kh, ho?c to h?n bnh th??ng.  C?m th?y ??y b?ng ho?c ch??ng b?ng.  ?au ? vng b?ng d??i.  Khng c?m th?y tho?i mi sau khi ??i ti?n. CH?N ?ON  Chuyn gia ch?m [place] s?c kh?e c?a qu v? s? h?i v? b?nh s? v khm th?c th? cho qu v?. C th? c?n ki?m tra thm trong tr??ng h?p to bn n?ng. M?t s? ki?m tra c th? bao g?m:  Ch?p X quang c ch?t c?n quang ?? ki?m tra tr?c trng, ??i trng, v ?i khi c? ru?t non c?a qu v?.  N?i soi tr?c trng sigma ?? ki?m tra ph?n pha d??i c?a ??i trng.  Th? thu?t soi ??i trng ?? khm ton b? ??i trng. ?I?U TR?  ?i?u tr? ty thu?c [place] m?c ?? tr?m tr?ng c?a ch?ng to bn v nguyn nhn gy to bn. ?i?u tr? thng qua ch? ?? ?n u?ng bao g?m u?ng nhi?u n??c h?n v ?n th?c ?n c nhi?u ch?t x? h?n. ?i?u tr? thng qua l?i s?ng c th? bao g?m vi?c t?p th? d?c th??ng xuyn. N?u nh?ng khuy?n ngh? v? ch? ?? ?n v l?i s?ng khng c tc d?ng, chuyn gia ch?m [place] s?c kh?e c th? khuy?n ngh? qu v? dng cc  lo?i thu?c nhu?n  trng khng c?n k ??n ?? gip qu v? ??i ti?n. C th? ph?i k ??n thu?c c?n k ??n n?u thu?c khng c?n k ??n khng c tc d?ng.  H??NG D?N CH?M [place] T?I NH   ?n th?c ?n c nhi?u ch?t x? nh? tri cy, rau, ng? c?c nguyn h?t, v cc lo?i ??u.  H?n ch? th?c ?n c nhi?u ch?t bo v ???ng ch? bi?n s?n, ch?ng h?n khoai ty chin, bnh hamburger, bnh quy, k?o, v soda.  C th? dng th?c ph?m ch?c n?ng c b? sung ch?t x? [place] kh?u ph?n ?n c?a qu v? n?u qu v? khng th? dng ?? ch?t x? t? th?c ?n.  U?ng ?? n??c ?? gi? cho n??c ti?u trong ho?c vng nh?t.  T?p th? d?c th??ng xuyn ho?c theo ch? d?n c?a chuyn gia ch?m [place] s?c kh?e.  [place]mar nh v? sinh ngay khi qu v? c nhu c?u. Khng nh?n ?i ??i ti?n.  Ch? s? d?ng thu?c khng c?n k ??n ho?c thu?c c?n k ??n theo ch? d?n c?a chuyn gia ch?m [place] s?c kh?e.Khng dng cc lo?i thu?c ch?ng to bn khc m khng bn b?c tr??c v?i chuyn gia ch?m [place] s?c kh?e. NGAY L?P T?C ?I KHM N?U:   ?i ??i ti?n ra mu ?? t??i.  Ch?ng to bn ko di h?n 4 ngy v tr?m tr?ng h?n.  Qu v? b? ?au b?ng ho?c ?au tr?c trng.  Phn c?a qu v? m?ng, trng nh? bt ch.  Qu v? b? s?t cn khng r nguyn nhn. ??M B?O QU V?:   Hi?u r cc h??ng d?n ny.  S? theo di tnh tr?ng c?a mnh.  S? yu c?u tr? gip ngay l?p t?c n?u qu v? c?m th?y khng kh?e ho?c th?y tr?m tr?ng h?n. Document Released: 08/14/2010 Document Revised: 05/04/2013 [facility] Patient Information 2015 [place], [place]. This information is not intended to replace advice given to you by your health care provider. Make sure you discuss any questions you have with your health care provider.

## 2014-11-18 ENCOUNTER — Telehealth: Payer: Self-pay

## 2014-11-18 NOTE — Telephone Encounter (Signed)
Pt is very anxious to talk with dr Conley Rollsle about her note from what i can understand -language barrier

## 2014-11-21 NOTE — Telephone Encounter (Signed)
Spoke with patient, she has sinu symptoms and HA, has not taken nasocrot and.or zyrtec, she gets sinus HAs and canot go to school . woul dlike a note 1-2 episodes per week. Will give note for 3 months. Has a hx of igraines, needs re-eval if allergy meds and otc tylenol does not help. Less constipated.

## 2014-11-30 ENCOUNTER — Encounter: Payer: Self-pay | Admitting: Family Medicine

## 2014-12-29 ENCOUNTER — Ambulatory Visit (INDEPENDENT_AMBULATORY_CARE_PROVIDER_SITE_OTHER): Payer: Self-pay | Admitting: Family Medicine

## 2014-12-29 VITALS — BP 102/60 | HR 81 | Temp 98.1°F | Resp 18 | Ht <= 58 in | Wt 110.0 lb

## 2014-12-29 DIAGNOSIS — K5900 Constipation, unspecified: Secondary | ICD-10-CM

## 2014-12-29 DIAGNOSIS — K625 Hemorrhage of anus and rectum: Secondary | ICD-10-CM

## 2014-12-29 DIAGNOSIS — K649 Unspecified hemorrhoids: Secondary | ICD-10-CM

## 2014-12-29 DIAGNOSIS — K219 Gastro-esophageal reflux disease without esophagitis: Secondary | ICD-10-CM

## 2014-12-29 DIAGNOSIS — G4489 Other headache syndrome: Secondary | ICD-10-CM

## 2014-12-29 LAB — POCT CBC
Granulocyte percent: 60.3 %G (ref 37–80)
HCT, POC: 39.7 % (ref 37.7–47.9)
Hemoglobin: 12.2 g/dL (ref 12.2–16.2)
Lymph, poc: 2.9 (ref 0.6–3.4)
MCH, POC: 26.4 pg — AB (ref 27–31.2)
MCHC: 30.7 g/dL — AB (ref 31.8–35.4)
MCV: 86 fL (ref 80–97)
MID (cbc): 0.8 (ref 0–0.9)
MPV: 6.7 fL (ref 0–99.8)
POC Granulocyte: 5.5 (ref 2–6.9)
POC LYMPH PERCENT: 31.5 %L (ref 10–50)
POC MID %: 8.2 % (ref 0–12)
Platelet Count, POC: 321 10*3/uL (ref 142–424)
RBC: 4.61 M/uL (ref 4.04–5.48)
RDW, POC: 12.3 %
WBC: 9.2 10*3/uL (ref 4.6–10.2)

## 2014-12-29 LAB — TSH: TSH: 3.121 u[IU]/mL (ref 0.350–4.500)

## 2014-12-29 MED ORDER — POLYETHYLENE GLYCOL 3350 17 GM/SCOOP PO POWD: 17.0000 g | Freq: Two times a day (BID) | ORAL | Status: DC | PRN

## 2014-12-29 MED ORDER — OMEPRAZOLE 40 MG PO CPDR: 40.0000 mg | DELAYED_RELEASE_CAPSULE | Freq: Every day | ORAL | Status: DC

## 2014-12-29 MED ORDER — SUMATRIPTAN SUCCINATE 25 MG PO TABS: ORAL_TABLET | ORAL | Status: DC

## 2014-12-29 NOTE — Progress Notes (Signed)
Chief Complaint:  Chief Complaint  Patient presents with  . Follow-up    sxs back after stopping meds   . Gastrophageal Reflux    HPI: Theresa Garza is a 24 y.o. female who reports to [facility] today complaining of: 1. GERD sxs which were helped by taking a PPI, she has less sxs when she is taking the PPI. But sxs still persist. She has been without since finished rx  2. Constipation, she is having problems with having rectal bleeding after BM when she has to strain, she has been taking in water and  Some type of Asian drink that is supposed to help flush her stomach, she has  BRPR only with wiping , felt subjective fevers. She denies stomach pain, vomiting but has had  nausea with this.  3. She has had intermittent diffuse headaches. She has had HAs in the past. She has tried rx meds but does not remember what they are, She just has to sleep when this happens. She has had more frequent stress so the HAs have gotten worse, this is a problem since she is in school. She is going to the Language and has taken so many days off they are charging her for the classes but because of her HAs she cannot make it to class. States the over the counter meds do not work.  Light and nopise sensitivities, no eye pain, not the worst HA of her ;life, usually dull throbbing, makes her nauseated.   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  LMP was 12/24/14  History reviewed. No pertinent past medical history. History reviewed. No pertinent past surgical history. Social History   Social History  . Marital Status: Single    Spouse Name: N/A  . Number of Children: N/A  . Years of Education: N/A   Social History Main Topics  . Smoking status: Never Smoker   . Smokeless tobacco: None  . Alcohol Use: No  . Drug Use: None  . Sexual Activity: Not Asked   Other Topics Concern  . None   Social History Narrative   History reviewed. No pertinent family history. No Known Allergies Prior to  Admission medications   Medication Sig Start Date End Date Taking? Authorizing Provider  Ascorbic Acid (VITAMIN C PO) Take 1 tablet by mouth daily.    Historical Provider, MD  azithromycin (ZITHROMAX) 250 MG tablet Take by mouth daily.    Historical Provider, MD  benzonatate (TESSALON) 100 MG capsule Take 1 capsule (100 mg total) by mouth every 8 (eight) hours. Patient not taking: Reported on 10/05/2014 06/21/14   Renne Crigler, PA-C  omeprazole (PRILOSEC) 40 MG capsule Take 1 capsule (40 mg total) by mouth daily. Patient not taking: Reported on 12/29/2014 10/05/14   Thao P Le, DO  polyethylene glycol powder (GLYCOLAX/MIRALAX) powder Take 17 g by mouth 2 (two) times daily as needed. Patient not taking: Reported on 12/29/2014 10/05/14   Thao P Le, DO     ROS: The patient denies fevers, chills, night sweats, unintentional weight loss, chest pain, palpitations, wheezing, dyspnea on exertion, nausea, vomiting, abdominal pain, dysuria, hematuria, melena, numbness, weakness, or tingling.   All other systems have been reviewed and were otherwise negative with the exception of those mentioned in the HPI and as above.    PHYSICAL EXAM: Filed Vitals:   12/29/14 1325  BP: 102/60  Pulse: 81  Temp: 98.1 F (36.7 C)  Resp: 18   Body  mass index is 23 kg/(m^2).   General: Alert, no acute distress HEENT:  Normocephalic, atraumatic, oropharynx patent. EOMI, PERRLA Fundo exam normal, TM normal Cardiovascular:  Regular rate and rhythm, no rubs murmurs or gallops.  No Carotid bruits, radial pulse intact. No pedal edema.  Respiratory: Clear to auscultation bilaterally.  No wheezes, rales, or rhonchi.  No cyanosis, no use of accessory musculature Abdominal: No organomegaly, abdomen is soft and non-tender, positive bowel sounds. No masses. Skin: No rashes. Neurologic: Facial musculature symmetric. CN 2-12 grossly normal Psychiatric: Patient acts appropriately throughout our interaction. Lymphatic: No cervical  or submandibular lymphadenopathy Musculoskeletal: Gait intact. No edema, tenderness   LABS: Results for orders placed or performed in visit on 12/29/14  POCT CBC  Result Value Ref Range   WBC 9.2 4.6 - 10.2 K/uL   Lymph, poc 2.9 0.6 - 3.4   POC LYMPH PERCENT 31.5 10 - 50 %L   MID (cbc) 0.8 0 - 0.9   POC MID % 8.2 0 - 12 %M   POC Granulocyte 5.5 2 - 6.9   Granulocyte percent 60.3 37 - 80 %G   RBC 4.61 4.04 - 5.48 M/uL   Hemoglobin 12.2 12.2 - 16.2 g/dL   HCT, POC 08.6 57.8 - 47.9 %   MCV 86.0 80 - 97 fL   MCH, POC 26.4 (A) 27 - 31.2 pg   MCHC 30.7 (A) 31.8 - 35.4 g/dL   RDW, POC 46.9 %   Platelet Count, POC 321 142 - 424 K/uL   MPV 6.7 0 - 99.8 fL     EKG/XRAY:   Primary read interpreted by Dr. Conley Rolls at [facility].   ASSESSMENT/PLAN: Encounter Diagnoses  Name Primary?  . Gastroesophageal reflux disease, esophagitis presence not specified Yes  . Hemorrhoids, unspecified hemorrhoid type   . BRBPR (bright red blood per rectum)   . Other headache syndrome    Rx Protonix, refer to GI Recommend miralax, increase fiber , and stop taking those Asian concoctions that are not working for her Rx Imitrex prn FU prn    Gross sideeffects, risk and benefits, and alternatives of medications d/w patient. Patient is aware that all medications have potential sideeffects and we are unable to predict every sideeffect or drug-drug interaction that may occur.  Thao Le DO  12/29/2014 2:23 PM

## 2015-01-03 DIAGNOSIS — K219 Gastro-esophageal reflux disease without esophagitis: Secondary | ICD-10-CM | POA: Insufficient documentation

## 2015-01-03 DIAGNOSIS — G4489 Other headache syndrome: Secondary | ICD-10-CM | POA: Insufficient documentation

## 2015-01-03 DIAGNOSIS — K5900 Constipation, unspecified: Secondary | ICD-10-CM | POA: Insufficient documentation

## 2015-01-03 DIAGNOSIS — R519 Headache, unspecified: Secondary | ICD-10-CM | POA: Insufficient documentation

## 2015-01-12 ENCOUNTER — Encounter: Payer: Self-pay | Admitting: Family Medicine

## 2015-01-24 ENCOUNTER — Telehealth: Payer: Self-pay

## 2015-01-24 NOTE — Telephone Encounter (Signed)
Pt returning our call  ° °

## 2015-03-06 ENCOUNTER — Ambulatory Visit (INDEPENDENT_AMBULATORY_CARE_PROVIDER_SITE_OTHER): Payer: Self-pay | Admitting: Family Medicine

## 2015-03-06 DIAGNOSIS — B079 Viral wart, unspecified: Secondary | ICD-10-CM

## 2015-03-06 DIAGNOSIS — B078 Other viral warts: Secondary | ICD-10-CM

## 2015-03-06 NOTE — Patient Instructions (Signed)
Cryosurgery for Skin Conditions, Care After Refer to this sheet in the next few weeks. These instructions provide you with information on caring for yourself after your procedure. Your health care provider may also give you more specific instructions. Your treatment has been planned according to current medical practices, but problems sometimes occur. Call your health care provider if you have any problems or questions after your procedure. WHAT TO EXPECT AFTER THE PROCEDURE After your procedure, it is typical to have the following:  The treated area will become red and swollen shortly after the procedure.  Within 2-3 days, a blister will form over the treated area. The blister may contain a small amount of blood.  In about 2 weeks, the blister will break on its own, leaving a scab. The treated area will then heal. After healing, there is usually little or no scarring. HOME CARE INSTRUCTIONS   Keep the treated area clean, dry, and covered with a bandage until healed. The area can be cleaned as usual with soap and water.  You may take showers. If your bandage gets wet, change it right away.  Do not pick at your blister or try to break it open. This can cause infection and scarring.  Do not apply any medicine, cream, or lotion to the treated area unless directed to do so by your health care provider. SEEK MEDICAL CARE IF:   You have increased pain, swelling, redness, fluid drainage, or bleeding in the treated area.  Your blister becomes large and painful.   This information is not intended to replace advice given to you by your health care provider. Make sure you discuss any questions you have with your health care provider.   Document Released: 11/16/2004 Document Revised: 12/30/2012 Document Reviewed: 11/27/2012 Elsevier Interactive Patient Education 2016 Elsevier Inc. Cryosurgery for Skin Conditions Cryosurgery, also called cryotherapy, is the use of extreme cold to freeze and remove  abnormal or diseased tissue. Growths on the skin such as warts, precancerous skin lesions (actinic keratoses), and some kinds of skin cancer may be removed with cryosurgery. LET [facility] CARE PROVIDER KNOW ABOUT:  Any allergies you have.  All medicines you are taking, including vitamins, herbs, eye drops, creams, and over-the-counter medicines.  Previous problems you or members of your family have had with the use of anesthetics.  Any blood disorders you have.  Previous surgeries you have had.  Medical conditions you have. RISKS AND COMPLICATIONS Generally, this is a safe procedure. However, as with any procedure, complications can occur. Possible complications include:  Scars.  Changes in skin color (lighter or darker than normal skin tone).  Swelling.  Nerve damage and loss of feeling (rare). BEFORE THE PROCEDURE No preparation is necessary. PROCEDURE  Cryosurgery usually takes a few minutes and can be done in your health care provider's office. There are different methods for performing cryosurgery.   Your health care provider may use a device (probe) that has liquid nitrogen flowing through it. The liquid nitrogen cools the probe. The probe is then applied to the growth until it is frozen and destroyed.  Your health care provider may spray liquid nitrogen directly on the growth. AFTER THE PROCEDURE Shortly after the procedure, the treated area will become red and swollen. This is normal. You will be advised to keep the treated area clean and covered with a bandage until healed. You will be able to go home shortly after the procedure. You may need the treatment again if the growth comes back.  This information is not intended to replace advice given to you by your health care provider. Make sure you discuss any questions you have with your health care provider.   Document Released: 04/26/2000 Document Revised: 12/30/2012 Document Reviewed: 11/27/2012 Elsevier Interactive  Patient Education 2016 [employer]   Warts Warts are small growths on the skin. They are common and can occur on various areas of the body. A person may have one wart or multiple warts. Most warts are not painful, and they usually do not cause problems. However, warts can cause pain if they are large or occur in an area of the body where pressure will be applied to them, such as the bottom of the foot. In many cases, warts do not require treatment. They usually go away on their own over a period of many months to a couple years. Various treatments may be done for warts that cause problems or do not go away. Sometimes, warts go away and then come back again. CAUSES Warts are caused by a type of virus that is called human papillomavirus (HPV). This virus can spread from person to person through direct contact. Warts can also spread to other areas of the body when a person scratches a wart and then scratches another area of his or her body.  RISK FACTORS Warts are more likely to develop in:  People who are 4410-23 years of age.  People who have a weakened body defense system (immune system). SYMPTOMS A wart may be round or oval or have an irregular shape. Most warts have a rough surface. Warts may range in color from skin color to light yellow, brown, or gray. They are generally less than  inch (1.3 cm) in size. Most warts are painless, but some can be painful when pressure is applied to them. DIAGNOSIS A wart can usually be diagnosed from its appearance. In some cases, a tissue sample may be removed (biopsy) to be looked at under a microscope. TREATMENT In many cases, warts do not need treatment. If treatment is needed, options may include:  Applying medicated solutions, creams, or patches to the wart. These may be over-the-counter or prescription medicines that make the skin soft so that layers will gradually shed away. In many cases, the medicine is applied one or two times per day and covered  with a bandage.  Putting duct tape over the top of the wart (occlusion). You will leave the tape in place for as long as told by your health care provider, then you will replace it with a new strip of tape. This is done until the wart goes away.  Freezing the wart with liquid nitrogen (cryotherapy).  Burning the wart with:  Laser treatment.  An electrified probe (electrocautery).  Injection of a medicine (Candida antigen) into the wart to help the body's immune system to fight off the wart.  Surgery to remove the wart. HOME CARE INSTRUCTIONS  Apply over-the-counter and prescription medicines only as told by your health care provider.  Do not apply over-the-counter wart medicines to your face or genitals before you ask your health care provider if it is okay to do so.  Do not scratch or pick at a wart.  Wash your hands after you touch a wart.  Avoid shaving hair that is over a wart.  Keep all follow-up visits as told by your health care provider. This is important. SEEK MEDICAL CARE IF:  Your warts do not improve after treatment.  You have redness, swelling, or  pain at the site of a wart.  You have bleeding from a wart that does not stop with light pressure.  You have diabetes and you develop a wart.   This information is not intended to replace advice given to you by your health care provider. Make sure you discuss any questions you have with your health care provider.   Document Released: 02/06/2005 Document Revised: 01/18/2015 Document Reviewed: 07/25/2014 Elsevier Interactive Patient Education 2016 Elsevier Inc.  WARTS (Verrucae)  Warts are caused by a virus that has invaded the skin.  They are more common in young adults and children and a small percentage will resolve on their own.  There are many types of warts including mosaic warts (large flat), vulgaris (domed warts-have pearl like appearance), and plantar warts (flat or cauliflower like appearance).  Warts  are highly contagious and may be picked up from any surface.  Warts thrive in a warm moist environment and are common near pools, showers, and locker room floors.  Any microscopic cut in the skin is where the virus enters and becomes a wart.  Warts are very difficult to treat and get rid of.  Patience is necessary in the treatment of this virus.  It may take months to cure and different methods may have to be used to get rid of your wart.  Standard Initial Treatment is:  Periodic debridement of the wart and application of Canthacur to each lesion (a blistering agent that will slough off the warty skin)  Dispensing of topical treatments/prescriptions to apply to the wart at home  Other options include:  Excision of the lesion-numbing the skin around the wart and cutting it out-requires daily soaks post-operatively and takes about 2-3 weeks to fully heal  Excision with CO2 Laser-Performed at the surgical center your foot is numbed up and the lesions are all cut out and then lasered with a high power laser.  Very good for multiple warts that are resistant.  Cimetidine (Tagamet)-Oral agent used in high does--has shown better results in children  How do I apply the standard topical treatments?   Salicylic Acid (Compound W wart remover liquid or gel-available at drug or grocery stores)-Apply a dime size thickness over the wart and cover with duct tape-apply at night so the medication does not spread out to the good skin.  The skin will turn white and slowly blister off.  Use a pumice stone daily to remove the white skin as best you can.  If the skin gets too raw and painful, discontinue for a few days then resume.  Aldara (Imiquimod)-this is an immune response modifier.  They come in little packets so try to get at least 2 days out of each packet if you can.  Apply a small amount to the lesion and cover with duct tape.  Do not rub it in-let it absorb on its own.  Good to apply each morning.  Other  Helpful Hints:  Wash shoes that can be washed in the washing machine 2-3 x per month with some bleach  Use Lysol in shoes that cannot be washed and wipe out with a cloth 1 x per week-allow to dry for 8 hours before wearing again  Use a bleach solution (1 part bleach to 3 parts water) in your tub or shower to reduce the spread of the virus to yourself and others  Use aqua socks or clean sandals when at the pool or locker room to reduce the chance of picking up the virus  or spreading it to others

## 2015-03-06 NOTE — Progress Notes (Signed)
   Subjective:    Patient ID: Theresa Garza, female    DOB: 02/19/1991, 24 y.o.   MRN: [id number] Chief Complaint  Patient presents with  . pain in finger    right finger/ swelling and discolor on tip of finger, x 1 month     HPI  She has had a hard place on her finger.  It developed a little blood blister which busted and now has a pit with a dark stippled center.  The firm tissue around it is painful. Never had anything similar prior.  Her cousin treated it incense once which turned the whole area brownish but otherwise did not seem to help.d   Past Medical History  Diagnosis Date  . Allergy    Current Outpatient Prescriptions on File Prior to Visit  Medication Sig Dispense Refill  . omeprazole (PRILOSEC) 40 MG capsule Take 1 capsule (40 mg total) by mouth daily. 30 capsule 6  . polyethylene glycol powder (GLYCOLAX/MIRALAX) powder Take 17 g by mouth 2 (two) times daily as needed. 3350 g 1  . SUMAtriptan (IMITREX) 25 MG tablet May repeat every 6 hours, do not use more than 6 pills in  24 hours. 10 tablet 2   No current facility-administered medications on file prior to visit.   No Known Allergies   Review of Systems  Constitutional: Negative for fever, chills and diaphoresis.  Musculoskeletal: Negative for myalgias, joint swelling and arthralgias.  Skin: Positive for color change, rash and wound. Negative for pallor.  Hematological: Negative for adenopathy. Does not bruise/bleed easily.       Objective:  BP 112/60 mmHg  Pulse 78  Temp(Src) 98.5 F (36.9 C) (Oral)  Resp 16  Ht 4\' 10"  (1.473 m)  Wt 111 lb (50.349 kg)  BMI 23.21 kg/m2  SpO2 98%  LMP 01/31/2015  Physical Exam  Constitutional: She is oriented to person, place, and time. She appears well-developed and well-nourished. No distress.  HENT:  Head: Normocephalic and atraumatic.  Right Ear: External ear normal.  Eyes: Conjunctivae are normal. No scleral icterus.  Pulmonary/Chest: Effort normal.  Neurological: She  is alert and oriented to person, place, and time.  Skin: Skin is warm and dry. Lesion and rash noted. No abrasion, no bruising, no burn, no ecchymosis and no petechiae noted. Rash is nodular. She is not diaphoretic. No erythema.  Rt distal 4th finger pad on lateral aspect with 5mm firm clear nodule with central ulceration of 1-302mm with sev pinpoint stippling of ulcer base. Entire finger pad turned light brown diffusely in poorly defined area (from incense smoke per pt).  Psychiatric: She has a normal mood and affect. Her behavior is normal.       Assessment & Plan:   1. Verruca vulgaris   Cryosurg to lesion today tolerated well. Pt advised on after care, can follow with salicylic acid or other otc wart products after 2 wks until lesion completely gone or RTC for repeat trx.   I personally performed the services described in this documentation, which was scribed in my presence. The recorded information has been reviewed and considered, and addended by me as needed.  Norberto SorensonEva Shaw, MD MPH

## 2015-03-09 ENCOUNTER — Ambulatory Visit (INDEPENDENT_AMBULATORY_CARE_PROVIDER_SITE_OTHER): Payer: Self-pay | Admitting: Physician Assistant

## 2015-03-09 VITALS — BP 102/60 | HR 81 | Temp 98.6°F | Resp 18 | Ht <= 58 in | Wt 109.0 lb

## 2015-03-09 DIAGNOSIS — B078 Other viral warts: Secondary | ICD-10-CM

## 2015-03-09 DIAGNOSIS — B079 Viral wart, unspecified: Secondary | ICD-10-CM

## 2015-03-09 NOTE — Patient Instructions (Signed)
Dr Conley Rollsle schedules  10/31 - 2pm- 8pm 11/1 4pm-8pm  11/3 8am-2pm  Wart sticks to apply to the area if it does not come off with this freezing - refreeze in 2 weeks

## 2015-03-09 NOTE — Progress Notes (Signed)
   Theresa Garza  MRN: [id number] DOB: 07/03/1990  Subjective:  Pt presents to clinic for repeat freezing of the wart on her right hand 4th finger pad.  It is uncomfortable to the patient.  She states that she never saw a blister after the freezing earlier this week.  Patient Active Problem List   Diagnosis Date Noted  . Esophageal reflux 01/03/2015  . Other headache syndrome 01/03/2015  . CN (constipation) 01/03/2015    Current Outpatient Prescriptions on File Prior to Visit  Medication Sig Dispense Refill  . omeprazole (PRILOSEC) 40 MG capsule Take 1 capsule (40 mg total) by mouth daily. 30 capsule 6  . polyethylene glycol powder (GLYCOLAX/MIRALAX) powder Take 17 g by mouth 2 (two) times daily as needed. 3350 g 1  . SUMAtriptan (IMITREX) 25 MG tablet May repeat every 6 hours, do not use more than 6 pills in  24 hours. 10 tablet 2   No current facility-administered medications on file prior to visit.    No Known Allergies  Review of Systems  Constitutional: Negative for fever and chills.  Skin: Positive for wound.   Objective:  BP 102/60 mmHg  Pulse 81  Temp(Src) 98.6 F (37 C) (Oral)  Resp 18  Ht 4\' 10"  (1.473 m)  Wt 109 lb (49.442 kg)  BMI 22.79 kg/m2  SpO2 98%  LMP 01/31/2015  Physical Exam  Constitutional: She is oriented to person, place, and time and well-developed, well-nourished, and in no distress.  HENT:  Head: Normocephalic and atraumatic.  Right Ear: Hearing and external ear normal.  Left Ear: Hearing and external ear normal.  Eyes: Conjunctivae are normal.  Neck: Normal range of motion.  Pulmonary/Chest: Effort normal.  Neurological: She is alert and oriented to person, place, and time. Gait normal.  Skin: Skin is warm and dry.  Left 3rd finger - common wart - without any surrounding erythema  Psychiatric: Mood, memory, affect and judgment normal.  Vitals reviewed.  Procedure:  Consent obtained.  3 freeze thaw cycles for cryotherapy Assessment and Plan  :  Verruca vulgaris   Expect patient to get a blister reaction this time - she should peel the wart off once this happens and if it is not gone she will use either a wart stick daily or she will RTC for repeat cryotherapy in about 2 weeks.  Benny LennertSarah Weber PA-C  [facility] 03/09/2015 11:05 AM

## 2015-03-22 ENCOUNTER — Ambulatory Visit (INDEPENDENT_AMBULATORY_CARE_PROVIDER_SITE_OTHER): Payer: Self-pay | Admitting: Family Medicine

## 2015-03-22 VITALS — BP 100/60 | HR 100 | Temp 98.3°F | Resp 16 | Ht <= 58 in | Wt 109.9 lb

## 2015-03-22 DIAGNOSIS — B079 Viral wart, unspecified: Secondary | ICD-10-CM

## 2015-03-22 NOTE — Progress Notes (Addendum)
Subjective:    Patient ID: Theresa Garza, female    DOB: 03/15/1991, 24 y.o.   MRN: [id number] This chart was scribed for Theresa SorensonEva Jhovany Weidinger, MD by Littie Deedsichard Sun, Medical Scribe. This patient was seen in Room 9 and the patient's care was started at 12:41 PM.   Chief Complaint  Patient presents with  . Follow-up    finger pain right hand    HPI HPI Comments: Theresa Garza is a 24 y.o. female who presents to the [facility] for a follow-up for pain in the 4th finger of her right hand. Patient had been seen for this initially on 10/24 then again 3 days later. She was diagnosed with verruca vulgaris and received cryotherapy. Patient notes that she still has some pain to her finger today. The skin is still thickened and has a pinpoint deep red center. She is frustrated by her 3rd visit for this - reports that last time she did well after the 3 different cryo episodes but she had not been self treating at home with anything at any point.   Past Medical History  Diagnosis Date  . Allergy    Current Outpatient Prescriptions on File Prior to Visit  Medication Sig Dispense Refill  . omeprazole (PRILOSEC) 40 MG capsule Take 1 capsule (40 mg total) by mouth daily. 30 capsule 6  . polyethylene glycol powder (GLYCOLAX/MIRALAX) powder Take 17 g by mouth 2 (two) times daily as needed. 3350 g 1  . SUMAtriptan (IMITREX) 25 MG tablet May repeat every 6 hours, do not use more than 6 pills in  24 hours. 10 tablet 2   No current facility-administered medications on file prior to visit.   No Known Allergies    Review of Systems  Constitutional: Negative for fever, chills, diaphoresis, activity change, appetite change and unexpected weight change.  Musculoskeletal: Negative for myalgias, joint swelling and arthralgias.  Skin: Positive for rash and wound. Negative for color change and pallor.  Allergic/Immunologic: Negative for immunocompromised state.  Neurological: Negative for weakness and numbness.    Hematological: Negative for adenopathy. Does not bruise/bleed easily.       Objective:  BP 100/60 mmHg  Pulse 100  Temp(Src) 98.3 F (36.8 C) (Oral)  Resp 16  Ht 4\' 10"  (1.473 m)  Wt 109 lb 14.4 oz (49.85 kg)  BMI 22.98 kg/m2  SpO2 98%  LMP 01/31/2015  Physical Exam  Constitutional: She is oriented to person, place, and time. She appears well-developed and well-nourished. No distress.  HENT:  Head: Normocephalic and atraumatic.  Mouth/Throat: Oropharynx is clear and moist. No oropharyngeal exudate.  Eyes: Pupils are equal, round, and reactive to light.  Neck: Neck supple.  Cardiovascular: Normal rate.   Pulmonary/Chest: Effort normal.  Musculoskeletal: She exhibits no edema.  Neurological: She is alert and oriented to person, place, and time. No cranial nerve deficit.  Skin: Skin is warm and dry. Lesion noted. No petechiae noted. No erythema.  5 mm area of scaling rough thickened skin at her palmar medial aspect of distal 3rd finger with central erythematous pinpoint vascular pinpoint  Psychiatric: She has a normal mood and affect. Her behavior is normal.  Nursing note and vitals reviewed.         Assessment & Plan:   1. Viral wart on finger   Cryosurgery again to wart today.  3rd episode today. Pt wanting repeated very aggressive treatment as she does not want to have to come back for a 4th treatment and  she also does not want to have to do salicylic acid self-trx. Therefore, the cryosurgery was done in 3 different episodes today with good response. Pt advised that overtreatment can lead to scarring, excessive pain, hypopigmentation and she still wanted to proceed with aggressive recurrent freezing today.  I personally performed the services described in this documentation, which was scribed in my presence. The recorded information has been reviewed and considered, and addended by me as needed.  Theresa Sorenson, MD MPH   By signing my name below, I, Littie Deeds, attest that  this documentation has been prepared under the direction and in the presence of Theresa Sorenson, MD.  Electronically Signed: Littie Deeds, Medical Scribe. 03/22/2015. 12:41 PM.

## 2015-03-22 NOTE — Patient Instructions (Signed)
Warts Warts are small growths on the skin. They are common, and they are caused by a type of germ (virus). Warts can occur on many areas of the body. A person may have one wart or more than one wart. Warts can spread if you scratch a wart and then scratch normal skin. Most warts will go away over many months to a couple years. Treatments may be done if needed. HOME CARE  Apply over-the-counter and prescription medicines only as told by your doctor.  Do not apply over-the-counter wart medicines to your face or genitals before you ask your doctor if it is okay to do that.  Do not scratch or pick at a wart.  Wash your hands after you touch a wart.  Avoid shaving hair that is over a wart.  Keep all follow-up visits as told by your doctor. This is important. GET HELP IF:  Your warts do not improve after treatment.  You have redness, swelling, or pain at the site of a wart.  You have bleeding from a wart, and the bleeding does not stop when you put light pressure on the wart.  You have diabetes and you get a wart.   This information is not intended to replace advice given to you by your health care provider. Make sure you discuss any questions you have with your health care provider.   Document Released: 08/30/2010 Document Revised: 01/18/2015 Document Reviewed: 07/25/2014 Elsevier Interactive Patient Education 2016 Elsevier Inc.  Cryosurgery for Skin Conditions, Care After These instructions give you information on caring for yourself after your procedure. Your doctor may also give you more specific instructions. Call your doctor if you have any problems or questions after your procedure.  HOME CARE  Keep your treated skin clean, dry, and covered with a bandage until it heals.  Clean the skin as usual with soap and water.  You may take showers. If your bandage gets wet, change it right away.  Do not pick at your blister or try to break it open.  Do not put medicine, cream, or  lotion on your skin except as told by your doctor. GET HELP IF:  Your treated skin gets more painful, puffy (swollen), or red.  You notice more blood or fluid coming from your treated skin.  Your blister is large and painful.   This information is not intended to replace advice given to you by your health care provider. Make sure you discuss any questions you have with your health care provider.   Document Released: 07/22/2011 Document Revised: 12/30/2012 Document Reviewed: 11/27/2012 Elsevier Interactive Patient Education [employer]

## 2015-03-23 ENCOUNTER — Ambulatory Visit (INDEPENDENT_AMBULATORY_CARE_PROVIDER_SITE_OTHER): Payer: Self-pay | Admitting: Family Medicine

## 2015-03-23 VITALS — BP 100/72 | HR 76 | Temp 98.3°F | Resp 18 | Ht <= 58 in | Wt 110.6 lb

## 2015-03-23 DIAGNOSIS — J302 Other seasonal allergic rhinitis: Secondary | ICD-10-CM

## 2015-03-23 DIAGNOSIS — K219 Gastro-esophageal reflux disease without esophagitis: Secondary | ICD-10-CM

## 2015-03-23 DIAGNOSIS — M940 Chondrocostal junction syndrome [Tietze]: Secondary | ICD-10-CM

## 2015-03-23 NOTE — Progress Notes (Signed)
 Chief Complaint:  Chief Complaint  Patient presents with  . Chest Pain    1 week ago    HPI: Theresa Garza is a 24 y.o. female who [facility] complaining of rib pain bilaterally , she has some left sided chest pain  Starting From her epigastric area.  She has chills with this. She also has some associated headaches and dizziness with this. She states chest only hurts her when she touches it.  She has GERD and also sinus sxs, has had URI sxs for the last week. Coughing and sniffling due to allergies, she denies any SOB, palpitations, n/v/abd pain, jaw pain. She is low risk for MI/CVA Has not taken any meds for sinusitis/allegies or GERD  She denies any hsitory of MI or  arrhytmias She has no DM or HTN She denies hyperlipidemia  BP Readings from Last 3 Encounters:  03/23/15 100/72  03/22/15 100/60  03/09/15 102/60      Past Medical History  Diagnosis Date  . Allergy    History reviewed. No pertinent past surgical history. Social History   Social History  . Marital Status: Single    Spouse Name: N/A  . Number of Children: N/A  . Years of Education: N/A   Social History Main Topics  . Smoking status: Never Smoker   . Smokeless tobacco: None  . Alcohol Use: No  . Drug Use: None  . Sexual Activity: Not Asked   Other Topics Concern  . None   Social History Narrative   History reviewed. No pertinent family history. No Known Allergies Prior to Admission medications   Medication Sig Start Date End Date Taking? Authorizing Provider  omeprazole (PRILOSEC) 40 MG capsule Take 1 capsule (40 mg total) by mouth daily. 12/29/14  Yes  P , DO  polyethylene glycol powder (GLYCOLAX/MIRALAX) powder Take 17 g by mouth 2 (two) times daily as needed. 12/29/14  Yes  P , DO  SUMAtriptan (IMITREX) 25 MG tablet May repeat every 6 hours, do not use more than 6 pills in  24 hours. Patient not taking: Reported on 03/23/2015 12/29/14    P , DO     ROS: The  patient denies fevers,  night sweats, unintentional weight loss, palpitations, wheezing, dyspnea on exertion, nausea, vomiting, abdominal pain, dysuria, hematuria, melena, numbness, weakness, or tingling.  All other systems have been reviewed and were otherwise negative with the exception of those mentioned in the HPI and as above.    PHYSICAL EXAM: Filed Vitals:   03/23/15 1351  BP: 100/72  Pulse: 76  Temp: 98.3 F (36.8 C)  Resp: 18   Body mass index is 23.12 kg/(m^2).   General: Alert, no acute distress HEENT:  Normocephalic, atraumatic, oropharynx patent. EOMI, PERRLA Erythematous throat, no exudates, TM normal, + sinus tenderness, + erythematous/boggy nasal mucosa Cardiovascular:  Regular rate and rhythm, no rubs murmurs or gallops.  No Carotid bruits, radial pulse intact. No pedal edema. Tenderness with palpation left lower ribs Respiratory: Clear to auscultation bilaterally.  No wheezes, rales, or rhonchi.  No cyanosis, no use of accessory musculature Abdominal: No organomegaly, abdomen is soft and non-tender, positive bowel sounds. No masses. Skin: No rashes. Neurologic: Facial musculature symmetric. Psychiatric: Patient acts appropriately throughout our interaction. Lymphatic: No cervical or submandibular lymphadenopathy Musculoskeletal: Gait intact. No edema, tenderness   LABS: Results for orders placed or performed in visit on 12/29/14  TSH  Result Value Ref Range   TSH 3.121 0.350 -  4.500 uIU/mL  POCT CBC  Result Value Ref Range   WBC 9.2 4.6 - 10.2 K/uL   Lymph, poc 2.9 0.6 - 3.4   POC LYMPH PERCENT 31.5 10 - 50 %L   MID (cbc) 0.8 0 - 0.9   POC MID % 8.2 0 - 12 %M   POC Granulocyte 5.5 2 - 6.9   Granulocyte percent 60.3 37 - 80 %G   RBC 4.61 4.04 - 5.48 M/uL   Hemoglobin 12.2 12.2 - 16.2 g/dL   HCT, POC 16.1 09.6 - 47.9 %   MCV 86.0 80 - 97 fL   MCH, POC 26.4 (A) 27 - 31.2 pg   MCHC 30.7 (A) 31.8 - 35.4 g/dL   RDW, POC 04.5 %   Platelet Count, POC 321  142 - 424 K/uL   MPV 6.7 0 - 99.8 fL     EKG/XRAY:   Primary read interpreted by Dr. Conley Rolls at [facility].   ASSESSMENT/PLAN: Encounter Diagnoses  Name Primary?  . Costochondritis Yes  . Gastroesophageal reflux disease without esophagitis   . Other seasonal allergic rhinitis    She should try otc meds Nasacort  PPI Ibuprofen/tylenol prn pain., will return for chest xray and ekg . LAbs and exam reassuring  Gross sideeffects, risk and benefits, and alternatives of medications d/w patient. Patient is aware that all medications have potential sideeffects and we are unable to predict every sideeffect or drug-drug interaction that may occur.    DO  03/28/2015 1:00 PM

## 2015-03-23 NOTE — Patient Instructions (Signed)
Hay Fever Hay fever is an allergic reaction to particles in the air. It cannot be passed from person to person. It cannot be cured, but it can be controlled. CAUSES  Hay fever is caused by something that triggers an allergic reaction (allergens). The following are examples of allergens:  Ragweed.  Feathers.  Animal dander.  Grass and tree pollens.  Cigarette smoke.  House dust.  Pollution. SYMPTOMS   Sneezing.  Runny or stuffy nose.  Tearing eyes.  Itchy eyes, nose, mouth, throat, skin, or other area.  Sore throat.  Headache.  Decreased sense of smell or taste. DIAGNOSIS Your caregiver will perform a physical exam and ask questions about the symptoms you are having.Allergy testing may be done to determine exactly what triggers your hay fever.  TREATMENT   Over-the-counter medicines may help symptoms. These include:  Antihistamines.  Decongestants. These may help with nasal congestion.  Your caregiver may prescribe medicines if over-the-counter medicines do not work.  Some people benefit from allergy shots when other medicines are not helpful. HOME CARE INSTRUCTIONS   Avoid the allergen that is causing your symptoms, if possible.  Take all medicine as told by your caregiver. SEEK MEDICAL CARE IF:   You have severe allergy symptoms and your current medicines are not helping.  Your treatment was working at one time, but you are now experiencing symptoms.  You have sinus congestion and pressure.  You develop a fever or headache.  You have thick nasal discharge.  You have asthma and have a worsening cough and wheezing. SEEK IMMEDIATE MEDICAL CARE IF:   You have swelling of your tongue or lips.  You have trouble breathing.  You feel lightheaded or like you are going to faint.  You have cold sweats.  You have a fever.   This information is not intended to replace advice given to you by your health care provider. Make sure you discuss any  questions you have with your health care provider.   Document Released: 04/29/2005 Document Revised: 07/22/2011 Document Reviewed: 11/09/2014 Elsevier Interactive Patient Education 2016 Elsevier Inc. Vim s?n s??n (Costochondritis) Vim s?n s??n, ?i khi ???c g?i l h?i ch?ng Tietze, l tnh tr?ng s?ng v kch ?ng (vim) cc m (s?n) n?i x??ng s??n v?i x??ng ng?c (x??ng ?c). N gy ?au ? ng?c v khu v?c x??ng s??n. Vim s?n s??n th??ng t? kh?i sau m?t th?i gian. C th? m?t t?i 6 tu?n ho?c lu h?n ?? b?nh ?? h?n, ??c bi?t l n?u qu v? khng th? h?n ch? cc ho?t ??ng c?a mnh. NGUYN NHN  M?t s? tr??ng h?p vim s?n s??n khng r nguyn nhn. Nh?ng nguyn nhn c th? c bao g?m:  T?n th??ng (ch?n th??ng).  T?p th? d?c ho?c ho?t ??ng ch?ng h?n nh? nng nh?c.  Ho d? d?i. D?U HI?U V TRI?U CH?NG  ?au v c?m gic ?au ? ng?c v khu v?c x??ng s??n.  ?au n?ng h?n khi ho ho?c th? su.  ?au n?ng h?n khi c cc c? ??ng ring bi?t. CH?N ?ON  Chuyn gia ch?m Victor s?c kh?e c?a qu v? s? khm th?c th? v h?i v? nh?ng tri?u ch?ng c?a qu v?. C th? ch?p X quang l?ng ng?c ho?c cc xt nghi?m khc ?? lo?i tr? nh?ng v?n ?? khc. ?I?U TR?  Vim s?n s??n th??ng t? kh?i sau m?t th?i gian. Chuyn gia ch?m Luther s?c kh?e c th? k ??n thu?c ?? gi?m ?au. H??NG D?N CH?M [place] T?I NH   [place]  ho?t ??ng th? l?c g?ng s?c. C? g?ng khng ko c?ng x??ng s??n trong khi ho?t ??ng bnh th??ng. ?i?u ny c th? bao g?m b?t k? ho?t ??ng no c s? d?ng c? ng?c, b?ng, v ? m?ng s??n, ??c bi?t l n?u s? d?ng cc v?t n?ng.  Ch??m ? [place] vng b? ?nh h??ng trong 2 ngy ??u sau khi b?t ??u ?au.  Cho ? [place] ti nh?a.  ?? kh?n t?m Gerdeman gi?a da v ti.  ?? ? l?nh trong kho?ng 20 pht, 2 - 3 l?n m?t ngy.  Ch? s? d?ng thu?c khng c?n k ??n ho?c thu?c c?n k ??n theo ch? d?n c?a chuyn gia ch?m [place] s?c kh?e. ?I KHM N?U:  Qu v? b? t?y ?? ho?c s?ng ? cc kh?p x??ng s??n. ?y l nh?ng d?u hi?u nhi?m trng.  C?n ?au c?a qu v?  khng h?t m?c d ? ngh? ng?i ho?c dng thu?c. NGAY L?P T?C ?I KHM N?U:   C?n ?au c?a qu v? t?ng ln ho?c c?m th?y r?t kh ch?u.  Qu v? b? th? d?c ho?c kh th?.  Qu v? ho ra mu.  Qu v? b? ?au ng?c n?ng h?n, ?? m? hi, ho?c nn m?a.  Qu v? b? s?t ho?c cc tri?u ch?ng ko di h?n 2 - 3 ngy.  Qu v? b? s?t v cc tri?u ch?ng c?a qu v? ??t nhin n?ng ln. ??M B?O QU V?:   Hi?u cc h??ng d?n ny.  S? theo di tnh tr?ng c?a mnh.  S? yu c?u tr? gip ngay l?p t?c n?u b?n c?m th?y khng kh?e ho?c th?y tr?m tr?ng h?n.   Thng tin ny khng nh?m m?c ?ch thay th? cho l?i khuyn m chuyn gia ch?m [place] s?c kh?e ni v?i qu v?. Hy b?o ??m qu v? ph?i th?o lu?n b?t k? v?n ?? g m qu v? c v?i chuyn gia ch?m [place] s?c kh?e c?a qu v?.   Document Released: 02/06/2005 Document Revised: 02/17/2013 Elsevier Interactive Patient Education [employer]

## 2015-07-10 ENCOUNTER — Ambulatory Visit (INDEPENDENT_AMBULATORY_CARE_PROVIDER_SITE_OTHER): Payer: Self-pay | Admitting: Family Medicine

## 2015-07-10 VITALS — BP 102/70 | HR 100 | Temp 99.1°F | Resp 16 | Ht <= 58 in | Wt 107.0 lb

## 2015-07-10 DIAGNOSIS — B49 Unspecified mycosis: Secondary | ICD-10-CM

## 2015-07-10 DIAGNOSIS — L309 Dermatitis, unspecified: Secondary | ICD-10-CM

## 2015-07-10 DIAGNOSIS — L259 Unspecified contact dermatitis, unspecified cause: Secondary | ICD-10-CM

## 2015-07-10 MED ORDER — TRIAMCINOLONE ACETONIDE 0.1 % EX CREA: 1.0000 "application " | TOPICAL_CREAM | Freq: Two times a day (BID) | CUTANEOUS | Status: DC

## 2015-07-10 MED ORDER — PREDNISONE 10 MG PO TABS: ORAL_TABLET | ORAL | Status: DC

## 2015-07-10 NOTE — Progress Notes (Signed)
Chief Complaint:  Chief Complaint  Patient presents with  . redness in face and ear    HPI: Theresa Garza is a 25 y.o. female who reports to [facility] today complaining of contact dermatitis She has had this for 5 days, was sleeping outside and then noticed her face got flushed, No new products, travels or foods, no one else at home has it. SHe does nhave a hx of eczema Has not tried much for it except otc streoid creams.  Has nail fungus, uses fake nails,    Past Medical History  Diagnosis Date  . Allergy    No past surgical history on file. Social History   Social History  . Marital Status: Single    Spouse Name: N/A  . Number of Children: N/A  . Years of Education: N/A   Social History Main Topics  . Smoking status: Never Smoker   . Smokeless tobacco: None  . Alcohol Use: No  . Drug Use: None  . Sexual Activity: Not Asked   Other Topics Concern  . None   Social History Narrative   No family history on file. No Known Allergies Prior to Admission medications   Medication Sig Start Date End Date Taking? Authorizing Provider  diphenhydrAMINE (BENADRYL) 25 mg capsule Take 25 mg by mouth every 6 (six) hours as needed.   Yes Historical Provider, MD     ROS: The patient denies fevers, chills, night sweats, unintentional weight loss, chest pain, palpitations, wheezing, dyspnea on exertion, nausea, vomiting, abdominal pain, dysuria, hematuria, melena, numbness, weakness, or tingling.   All other systems have been reviewed and were otherwise negative with the exception of those mentioned in the HPI and as above.    PHYSICAL EXAM: Filed Vitals:   07/10/15 1452  BP: 102/70  Pulse: 100  Temp: 99.1 F (37.3 C)  Resp: 16   Body mass index is 22.37 kg/(m^2).   General: Alert, no acute distress HEENT:  Normocephalic, atraumatic, oropharynx patent. EOMI, PERRLA Cardiovascular:  Regular rate and rhythm, no rubs murmurs or gallops.  No Carotid bruits, radial pulse  intact. No pedal edema.  Respiratory: Clear to auscultation bilaterally.  No wheezes, rales, or rhonchi.  No cyanosis, no use of accessory musculature Abdominal: No organomegaly, abdomen is soft and non-tender, positive bowel sounds. No masses. Skin: + eczema and also nail fungus Neurologic: Facial musculature symmetric. Psychiatric: Patient acts appropriately throughout our interaction. Lymphatic: No cervical or submandibular lymphadenopathy Musculoskeletal: Gait intact. No edema, tenderness   LABS: Results for orders placed or performed in visit on 12/29/14  TSH  Result Value Ref Range   TSH 3.121 0.350 - 4.500 uIU/mL  POCT CBC  Result Value Ref Range   WBC 9.2 4.6 - 10.2 K/uL   Lymph, poc 2.9 0.6 - 3.4   POC LYMPH PERCENT 31.5 10 - 50 %L   MID (cbc) 0.8 0 - 0.9   POC MID % 8.2 0 - 12 %M   POC Granulocyte 5.5 2 - 6.9   Granulocyte percent 60.3 37 - 80 %G   RBC 4.61 4.04 - 5.48 M/uL   Hemoglobin 12.2 12.2 - 16.2 g/dL   HCT, POC 84.1 32.4 - 47.9 %   MCV 86.0 80 - 97 fL   MCH, POC 26.4 (A) 27 - 31.2 pg   MCHC 30.7 (A) 31.8 - 35.4 g/dL   RDW, POC 40.1 %   Platelet Count, POC 321 142 - 424 K/uL   MPV 6.7 0 -  99.8 fL     EKG/XRAY:   Primary read interpreted by Dr. Conley Rolls at [facility].   ASSESSMENT/PLAN: Encounter Diagnoses  Name Primary?  . Contact dermatitis Yes  . Fungal infection   . Eczema of both hands   . Eczema of face    otc hydrocortiosne for face Rx traiamcinolone for rest of body Antihistamine prn, mositurize prn Avoid getting fake nails due to fungal infection of nails, try otc lamisil Fu prn    Gross sideeffects, risk and benefits, and alternatives of medications d/w patient. Patient is aware that all medications have potential sideeffects and we are unable to predict every sideeffect or drug-drug interaction that may occur.  Thao Le DO  07/10/2015 4:24 PM

## 2015-08-22 IMAGING — CR DG CHEST 2V
2 series · 2 of 2 positions shown · non-contrast
Comparison: None.

CLINICAL DATA: Cough for 2 weeks.

EXAM:
CHEST  2 VIEW

[chest pa]
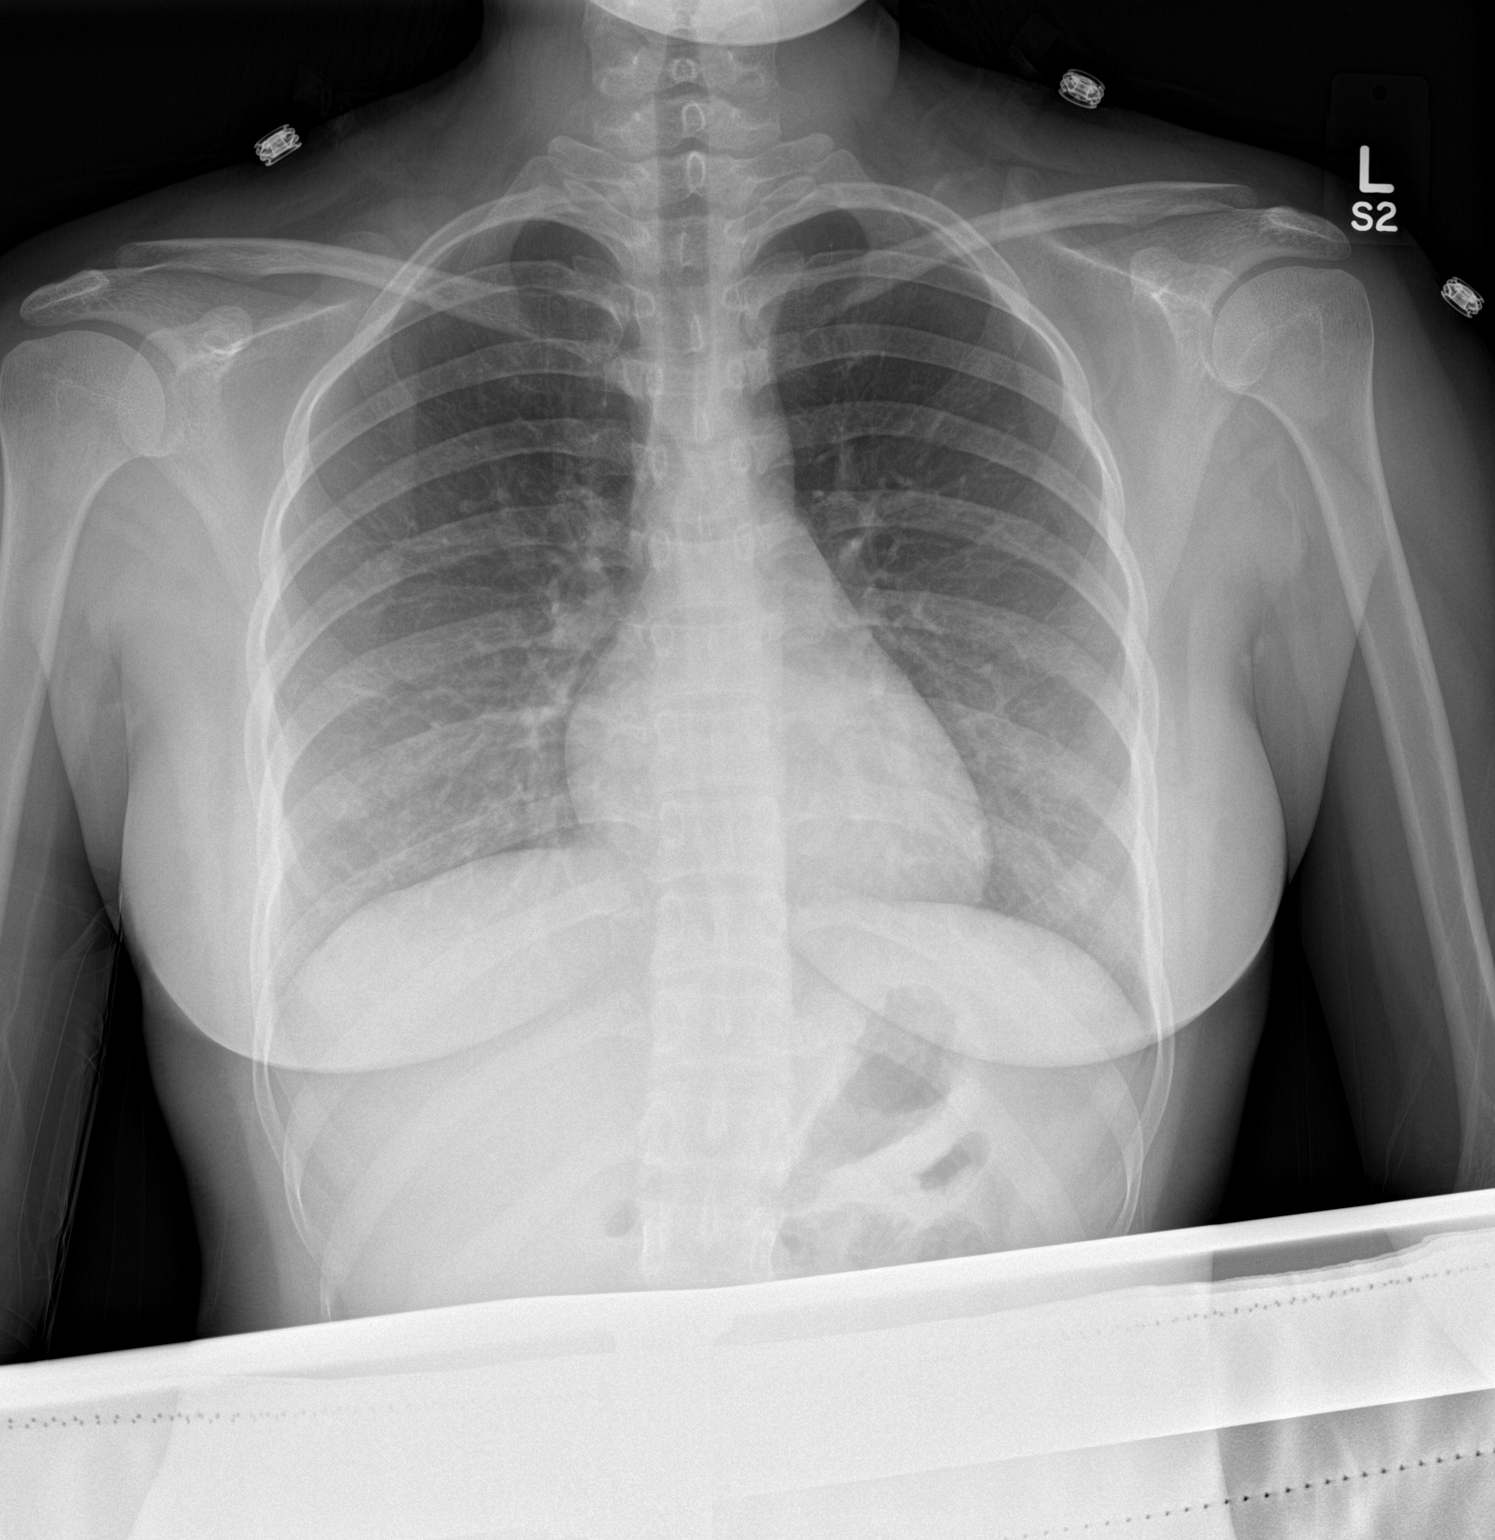

[chest lat]
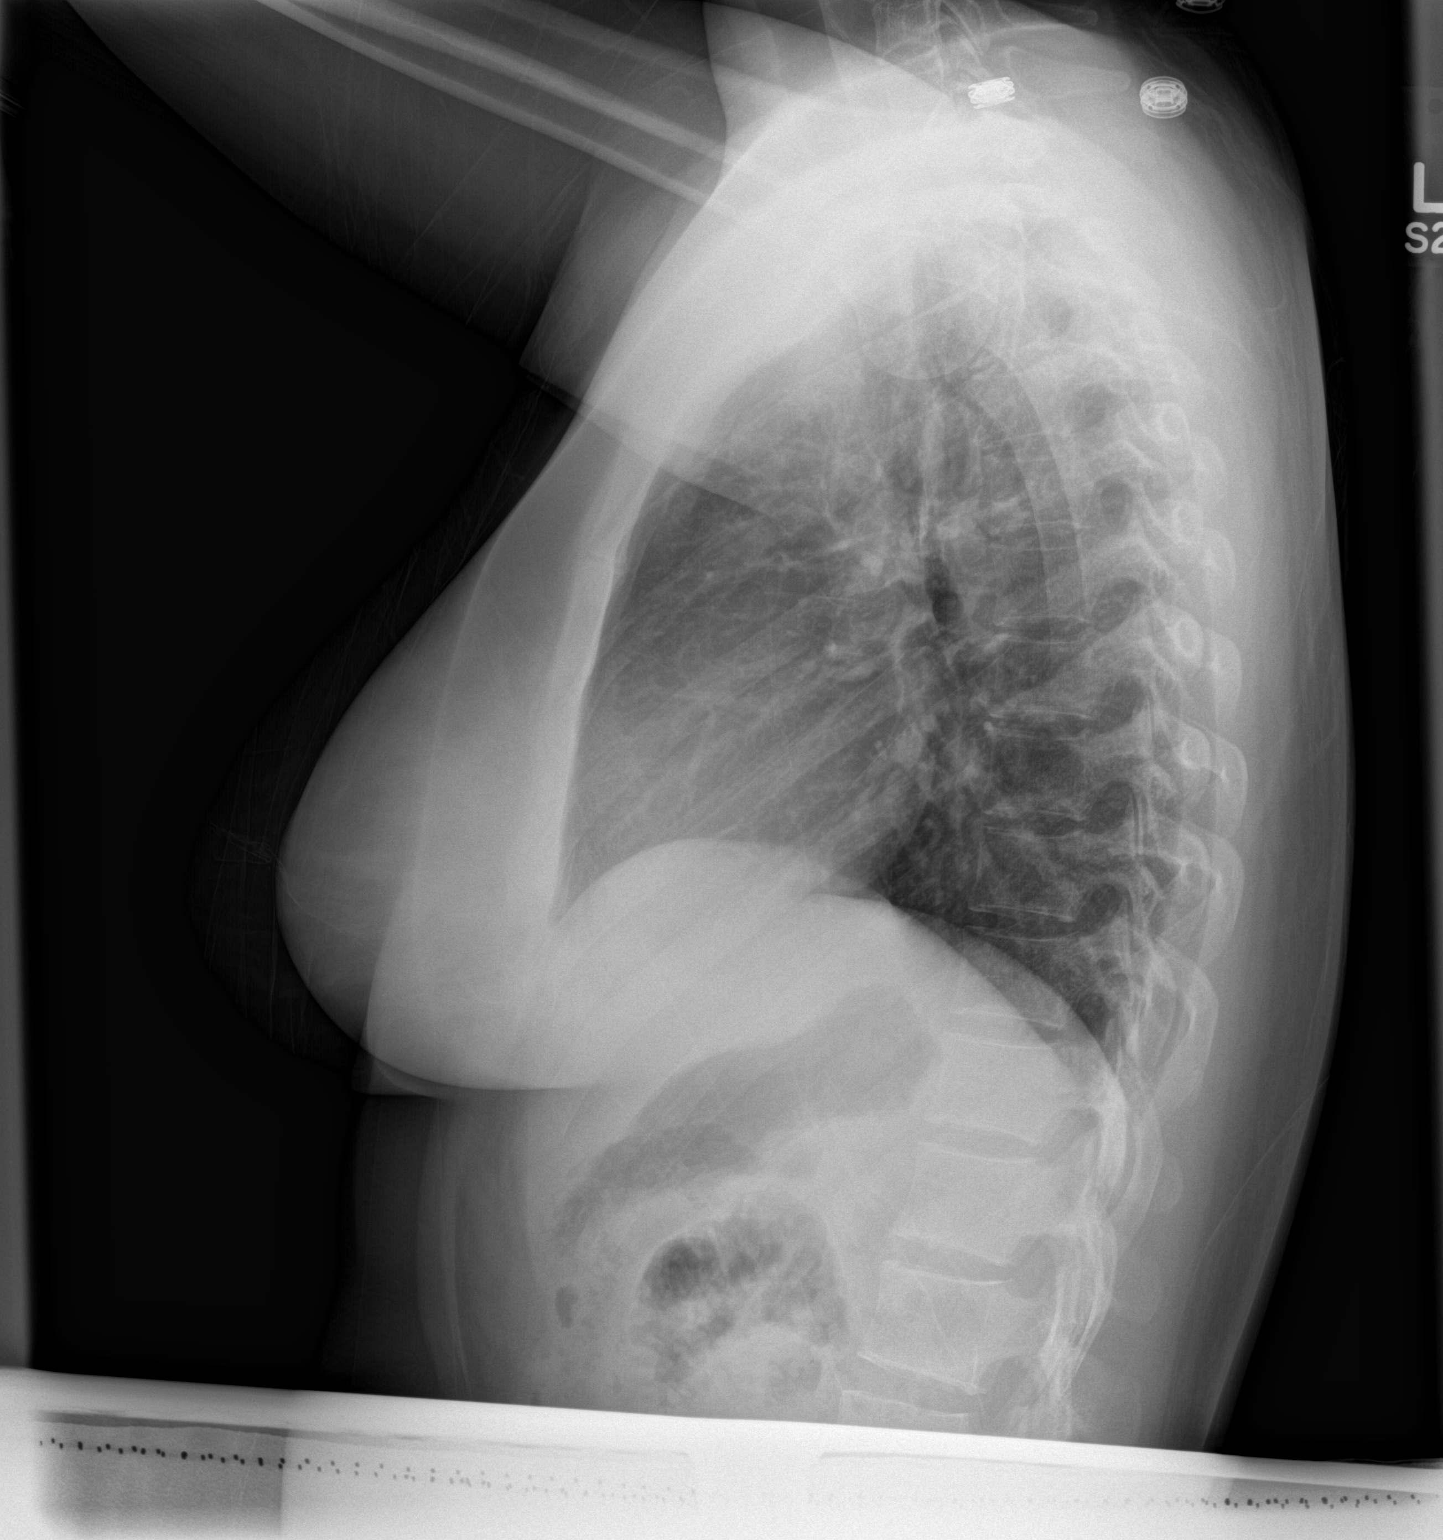

[2 of 2 positions shown; findings below may reference images not displayed]

FINDINGS: The heart size and mediastinal contours are within normal limits.
Both lungs are clear. The visualized skeletal structures are
unremarkable.
IMPRESSION: Normal chest.

## 2017-01-14 ENCOUNTER — Encounter: Payer: Self-pay | Admitting: Physician Assistant

## 2017-01-14 ENCOUNTER — Ambulatory Visit (INDEPENDENT_AMBULATORY_CARE_PROVIDER_SITE_OTHER): Payer: Self-pay | Admitting: Physician Assistant

## 2017-01-14 VITALS — BP 99/66 | HR 87 | Temp 98.6°F | Resp 16 | Ht <= 58 in | Wt 94.4 lb

## 2017-01-14 DIAGNOSIS — R208 Other disturbances of skin sensation: Secondary | ICD-10-CM

## 2017-01-14 DIAGNOSIS — J339 Nasal polyp, unspecified: Secondary | ICD-10-CM

## 2017-01-14 DIAGNOSIS — M542 Cervicalgia: Secondary | ICD-10-CM

## 2017-01-14 DIAGNOSIS — R0981 Nasal congestion: Secondary | ICD-10-CM

## 2017-01-14 DIAGNOSIS — R1013 Epigastric pain: Secondary | ICD-10-CM

## 2017-01-14 LAB — POCT CBC
Granulocyte percent: 67.8 %G (ref 37–80)
HCT, POC: 38.7 % (ref 37.7–47.9)
Hemoglobin: 12.8 g/dL (ref 12.2–16.2)
Lymph, poc: 2.5 (ref 0.6–3.4)
MCH, POC: 29.4 pg (ref 27–31.2)
MCHC: 33 g/dL (ref 31.8–35.4)
MCV: 89 fL (ref 80–97)
MID (cbc): 0.7 (ref 0–0.9)
MPV: 7.2 fL (ref 0–99.8)
POC Granulocyte: 6.8 (ref 2–6.9)
POC LYMPH PERCENT: 25.2 % (ref 10–50)
POC MID %: 7 %M (ref 0–12)
Platelet Count, POC: 387 10*3/uL (ref 142–424)
RBC: 4.34 M/uL (ref 4.04–5.48)
RDW, POC: 12.6 %
WBC: 10 10*3/uL (ref 4.6–10.2)

## 2017-01-14 MED ORDER — FLUTICASONE PROPIONATE 50 MCG/ACT NA SUSP: 2.0000 | Freq: Every day | NASAL | 6 refills | Status: DC

## 2017-01-14 MED ORDER — CYCLOBENZAPRINE HCL 5 MG PO TABS: 5.0000 mg | ORAL_TABLET | Freq: Three times a day (TID) | ORAL | 0 refills | Status: DC | PRN

## 2017-01-14 MED ORDER — LORATADINE-PSEUDOEPHEDRINE ER 10-240 MG PO TB24: 1.0000 | ORAL_TABLET | Freq: Every day | ORAL | 0 refills | Status: DC

## 2017-01-14 MED ORDER — RANITIDINE HCL 300 MG PO TABS: 300.0000 mg | ORAL_TABLET | Freq: Every day | ORAL | 0 refills | Status: DC

## 2017-01-14 MED ORDER — OMEPRAZOLE 20 MG PO CPDR: 20.0000 mg | DELAYED_RELEASE_CAPSULE | Freq: Every day | ORAL | 0 refills | Status: DC

## 2017-01-14 NOTE — Patient Instructions (Addendum)
For your sinuses: Flonase - 2 sprays each nostril twice daily.  Claritin-D - take this once daily. This is an antihistamine that will help relieve the pressure in your sinuses. This also may help ease your cough.  I believe you have problems with nasal polyps (see below for informations). You will receive a phone call in the next week or two to schedule an appointment with the ear, nose and throat specialist.  Stay well hydrated - drink 1-2 liters of water daily.  You will receive a phone call with the results of today's lab work.  Come back and see me in 4-6 weeks.    For your abdominal pain: Omeprazole 57m take this 30 minutes before breakfast.  Zantac 3035m- take this 30 minutes before bedtime.   Thank you for coming in today. I hope you feel we met your needs.  Feel free to call PCP if you have any questions or further requests.  Please consider signing up for MyChart if you do not already have it, as this is a great way to communicate with me.  Best,  Whitney McVey, PA-C   Nasal Polyps Nasal polyps are growths that form in the nose. Irritation and swelling (inflammation) in the nose or sinus openings can lead to changes in the tissue (mucosa) that lines these areas. Long-term inflammation causes the mucosa to balloon out or grow into a polyp. The polyp fills with watery mucus. Nasal polyps look like moist, gray grapes in the nose.Nasal polyps are not cancer. They do not increase your risk of cancer. You may have one nasal polyp or more than one. They can be small or large. In most cases, they form in both sides of the nose. Polyps can make it hard to breathe through your nose (nasal obstruction). What are the causes? The exact cause of nasal polyps is not known. What increases the risk? You are more likely to develop nasal polyps if you:  Have a family history of the condition.  Have a disease that causes inflammation in your nose or sinuses.  Have another condition that  affects your nose or sinuses, such as: ? Nasal allergies (allergic rhinitis). ? Long-term nasal obstruction (nonallergic rhinitis). ? Asthma. ? Nasal or sinus infection, especially fungal infection.  Are female.  Are older than 403ears of age.  Have a sensitivity to aspirin or alcohol.  Smoke.  Have a disease passed down through families that causes increased production of thick mucus (cystic fibrosis).  What are the signs or symptoms? Symptoms depend on the size of the polyps. Small polyps may cause few symptoms. When symptoms develop, they may include:  Nasal obstruction.  Decreased senses of smell and taste.  Runny nose.  The feeling of mucus going down the back of the throat (postnasal drip).  Headache, face pain, or sinus pressure.  Snoring.  Frequent nasal or sinus infections.  Itchy eyes.  How is this diagnosed?  Nasal polyps may be diagnosed based on your symptoms, your medical history, and a physical exam of the inside of your nose. You may also have tests, such as:  Imaging studies such as a CT scan or MRI to see how large your polyps are and if any are in your sinuses.  Skin or blood tests to find out if your polyps may be caused by allergies.  Washings or swabs taken from your nose to test for inflammation or infection.  How is this treated? Small nasal polyps that are not causing  symptoms may not need treatment. For large polyps that are causing symptoms, the goal of treatment is to reduce nasal obstruction and improve sinus drainage. Treatment may include:  A medicine to reduce inflammation (steroid). This is usually the first treatment. You may have to take steroids for a short or long period of time. Short-term steroids are usually taken as pills. Long-term steroid treatment is usually in the form of nose drops or spray.  Medicines to treat an underlying condition, such as allergies, asthma, or infection.  Surgery. This may be needed to remove nasal  polyps if medicine does not help.  Follow these instructions at home:  Take or use over-the-counter and prescription medicines only as told by your health care provider. Do not stop using your medicine even if you start to feel better.  Use solutions to wash or rinse out the inside of your nose (nasal washes or irrigations) as told by your health care provider.  Do not take medicines that contain aspirin if they make your symptoms worse.  Do not drink alcohol if it makes your symptoms worse.  Do not use any tobacco products, such as cigarettes, chewing tobacco, and e-cigarettes. If you need help quitting, ask your health care provider.  Keep all follow-up visits as told by your health care provider. This is important. Contact a health care provider if:  Your condition does not get better or it gets worse at home after treatment.  You have a fever.  You have headaches or pain in your face that is new or is getting worse.  You have a bloody nose. This information is not intended to replace advice given to you by your health care provider. Make sure you discuss any questions you have with your health care provider. Document Released: 08/21/2015 Document Revised: 10/05/2015 Document Reviewed: 07/20/2015 Elsevier Interactive Patient Education  2018 [employer].  IF you received an x-ray today, you will receive an invoice from [facility]. Please contact [facility] at [phone] with questions or concerns regarding your invoice.   IF you received labwork today, you will receive an invoice from [facility]. Please contact [facility] at [phone] with questions or concerns regarding your invoice.   Our billing staff will not be able to assist you with questions regarding bills from these companies.  You will be contacted with the lab results as soon as they are available. The fastest way to get your results is to activate your My Chart account. Instructions are located  on the last page of this paperwork. If you have not heard from [place] regarding the results in 2 weeks, please contact this office.

## 2017-01-14 NOTE — Progress Notes (Signed)
Theresa Garza  MRN: [id number] DOB: 11/22/1990  PCP: Patient, No Pcp Per  Subjective:  Pt is a 26 year old female who presents to clinic for multiple complaints.   Sinus pain and pressure - this is a chronic problem for her. She has been treated for multiple sinus infections in the past. She recently finished a course of Augmentin. Endorses some runny nose.  Denies fever, chills. Never been evaluated by ENT.   Cough x 4 weeks. Non-productive. Worse when she lays down.  She is not taking any medications to make this problem better.  C/o abdominal pain x several weeks. Pain will occasionally wake her up at night.  She used to be able to eat spicy foods, however cannot anymore. Endorses occasional vomiting after she lays down. Denies nausea, fever, chills.   Neck pain - b/l neck. Feels tight and sometimes causes her head to hurt. She is taking amitriptyline, naproxen - not helping much. Denies neck stiffness, vision changes, chest pain, bony pain.   Cold hands and feet - this may happen several times a day. She has been screened for thyroid problems in the past which were negative.  She admits to worrying about things in her life too much. She will sometimes wake up at night scared of something.   Review of Systems  HENT: Positive for congestion, postnasal drip, rhinorrhea, sinus pain and sinus pressure. Negative for sneezing, sore throat and tinnitus.   Respiratory: Positive for cough. Negative for chest tightness, shortness of breath and wheezing.   Cardiovascular: Negative for chest pain and palpitations.  Gastrointestinal: Positive for abdominal pain, nausea and vomiting. Negative for abdominal distention, blood in stool, constipation and diarrhea.  Musculoskeletal: Positive for back pain and neck pain. Negative for neck stiffness.  Psychiatric/Behavioral: Positive for sleep disturbance. The patient is nervous/anxious.     Patient Active Problem List   Diagnosis Date Noted  . Esophageal  reflux 01/03/2015  . Other headache syndrome 01/03/2015  . CN (constipation) 01/03/2015    Current Outpatient Prescriptions on File Prior to Visit  Medication Sig Dispense Refill  . diphenhydrAMINE (BENADRYL) 25 mg capsule Take 25 mg by mouth every 6 (six) hours as needed.    . triamcinolone cream (KENALOG) 0.1 % Apply 1 application topically 2 (two) times daily. Do not apply on face, may use on outside of ear and also on fingers (Patient not taking: Reported on 01/14/2017) 30 g 1   No current facility-administered medications on file prior to visit.     No Known Allergies   Objective:  BP 99/66   Pulse 87   Temp 98.6 F (37 C) (Oral)   Resp 16   Ht 4' 9.5" (1.461 m)   Wt 94 lb 6.4 oz (42.8 kg)   LMP 12/24/2016   SpO2 99%   BMI 20.07 kg/m   Physical Exam  Constitutional: She is oriented to person, place, and time and well-developed, well-nourished, and in no distress. No distress.  HENT:  Right Ear: Tympanic membrane normal.  Left Ear: Tympanic membrane normal.  Mouth/Throat: Uvula is midline.  Nasal polyps  Cardiovascular: Normal rate, regular rhythm and normal heart sounds.   Abdominal: Soft. Normal appearance and bowel sounds are normal. There is tenderness in the epigastric area, left upper quadrant and left lower quadrant. There is no rebound, no guarding and no CVA tenderness.  Neurological: She is alert and oriented to person, place, and time. GCS score is 15.  Skin: Skin is warm and dry.  Psychiatric: Mood, memory, affect and judgment normal.  Vitals reviewed.  Results for orders placed or performed in visit on 01/14/17  POCT CBC  Result Value Ref Range   WBC 10.0 4.6 - 10.2 K/uL   Lymph, poc 2.5 0.6 - 3.4   POC LYMPH PERCENT 25.2 10 - 50 %L   MID (cbc) 0.7 0 - 0.9   POC MID % 7.0 0 - 12 %M   POC Granulocyte 6.8 2 - 6.9   Granulocyte percent 67.8 37 - 80 %G   RBC 4.34 4.04 - 5.48 M/uL   Hemoglobin 12.8 12.2 - 16.2 g/dL   HCT, POC 07.3 71.0 - 47.9 %   MCV  89.0 80 - 97 fL   MCH, POC 29.4 27 - 31.2 pg   MCHC 33.0 31.8 - 35.4 g/dL   RDW, POC 62.6 %   Platelet Count, POC 387 142 - 424 K/uL   MPV 7.2 0 - 99.8 fL    Assessment and Plan :  1. Nasal polyp 2. Nasal congestion - fluticasone (FLONASE) 50 MCG/ACT nasal spray; Place 2 sprays into both nostrils daily.  Dispense: 16 g; Refill: 6 - loratadine-pseudoephedrine (CLARITIN-D 24 HOUR) 10-240 MG 24 hr tablet; Take 1 tablet by mouth daily.  Dispense: 60 tablet; Refill: 0 - Ambulatory referral to ENT - C/o chronic sinus problems with multiple failed antibiotic treatment. Suspect nasal polyps. Plan to refer to ENT for evaluation.  3. Epigastric pain - H. pylori breath test - POCT CBC - ranitidine (ZANTAC) 300 MG tablet; Take 1 tablet (300 mg total) by mouth at bedtime.  Dispense: 30 tablet; Refill: 0 - omeprazole (PRILOSEC) 20 MG capsule; Take 1 capsule (20 mg total) by mouth daily. 30 minutes before breakfast.  Dispense: 30 capsule; Refill: 0 - Lab is pending. Will contact with results. Start treatment for PUD and RTC in 4-6 weeks. Consider GI referral if no improvement.  4. Complaining of cold hands - Vitamin B12 - Negative thyroid work-up in the past. Suspect B12 deficiency vs anxiety. Plan to discuss anxiety at next OV.   5. Neck pain - cyclobenzaprine (FLEXERIL) 5 MG tablet; Take 1 tablet (5 mg total) by mouth 3 (three) times daily as needed for muscle spasms.  Dispense: 90 tablet; Refill: 0   Marco Collie, PA-C  Primary Care at [facility] 01/14/2017 4:15 PM

## 2017-01-15 LAB — VITAMIN B12: Vitamin B-12: 724 pg/mL (ref 232–1245)

## 2017-01-15 LAB — H. PYLORI BREATH TEST: H pylori Breath Test: NEGATIVE

## 2017-01-18 NOTE — Progress Notes (Signed)
Pls call pt and let her know she is negative for the bacteria in her stomach. Her B12 level is normal.  Come back in see me in 4 weeks for recheck.  Thank you!

## 2017-02-12 ENCOUNTER — Ambulatory Visit (INDEPENDENT_AMBULATORY_CARE_PROVIDER_SITE_OTHER): Payer: Self-pay | Admitting: Physician Assistant

## 2017-02-12 ENCOUNTER — Encounter: Payer: Self-pay | Admitting: Physician Assistant

## 2017-02-12 VITALS — BP 100/60 | HR 93 | Temp 98.6°F | Resp 16 | Ht <= 58 in | Wt 97.0 lb

## 2017-02-12 DIAGNOSIS — J3489 Other specified disorders of nose and nasal sinuses: Secondary | ICD-10-CM

## 2017-02-12 DIAGNOSIS — J321 Chronic frontal sinusitis: Secondary | ICD-10-CM

## 2017-02-12 MED ORDER — AMOXICILLIN-POT CLAVULANATE 875-125 MG PO TABS: 1.0000 | ORAL_TABLET | Freq: Two times a day (BID) | ORAL | 0 refills | Status: DC

## 2017-02-12 NOTE — Patient Instructions (Addendum)
Start taking Augmentin twice daily. This is an antibiotic. Take the entire course of the medicine, even if you start to feel better.  Keep your appointment with the ear, nose and throat doctor next week.   Thank you for coming in today. I hope you feel we met your needs.  Feel free to call PCP if you have any questions or further requests.  Please consider signing up for MyChart if you do not already have it, as this is a great way to communicate with me.  Best,  Whitney McVey, PA-C   Sinusitis, Adult Sinusitis is soreness and inflammation of your sinuses. Sinuses are hollow spaces in the bones around your face. Your sinuses are located:  Around your eyes.  In the middle of your forehead.  Behind your nose.  In your cheekbones.  Your sinuses and nasal passages are lined with a stringy fluid (mucus). Mucus normally drains out of your sinuses. When your nasal tissues become inflamed or swollen, the mucus can become trapped or blocked so air cannot flow through your sinuses. This allows bacteria, viruses, and funguses to grow, which leads to infection. Sinusitis can develop quickly and last for 7?10 days (acute) or for more than 12 weeks (chronic). Sinusitis often develops after a cold. What are the causes? This condition is caused by anything that creates swelling in the sinuses or stops mucus from draining, including:  Allergies.  Asthma.  Bacterial or viral infection.  Abnormally shaped bones between the nasal passages.  Nasal growths that contain mucus (nasal polyps).  Narrow sinus openings.  Pollutants, such as chemicals or irritants in the air.  A foreign object stuck in the nose.  A fungal infection. This is rare.  What increases the risk? The following factors may make you more likely to develop this condition:  Having allergies or asthma.  Having had a recent cold or respiratory tract infection.  Having structural deformities or blockages in your nose or  sinuses.  Having a weak immune system.  Doing a lot of swimming or diving.  Overusing nasal sprays.  Smoking.  What are the signs or symptoms? The main symptoms of this condition are pain and a feeling of pressure around the affected sinuses. Other symptoms include:  Upper toothache.  Earache.  Headache.  Bad breath.  Decreased sense of smell and taste.  A cough that may get worse at night.  Fatigue.  Fever.  Thick drainage from your nose. The drainage is often green and it may contain pus (purulent).  Stuffy nose or congestion.  Postnasal drip. This is when extra mucus collects in the throat or back of the nose.  Swelling and warmth over the affected sinuses.  Sore throat.  Sensitivity to light.  How is this diagnosed? This condition is diagnosed based on symptoms, a medical history, and a physical exam. To find out if your condition is acute or chronic, your health care provider may:  Look in your nose for signs of nasal polyps.  Tap over the affected sinus to check for signs of infection.  View the inside of your sinuses using an imaging device that has a light attached (endoscope).  If your health care provider suspects that you have chronic sinusitis, you may also:  Be tested for allergies.  Have a sample of mucus taken from your nose (nasal culture) and checked for bacteria.  Have a mucus sample examined to see if your sinusitis is related to an allergy.  If your sinusitis does not  respond to treatment and it lasts longer than 8 weeks, you may have an MRI or CT scan to check your sinuses. These scans also help to determine how severe your infection is. In rare cases, a bone biopsy may be done to rule out more serious types of fungal sinus disease. How is this treated? Treatment for sinusitis depends on the cause and whether your condition is chronic or acute. If a virus is causing your sinusitis, your symptoms will go away on their own within 10  days. You may be given medicines to relieve your symptoms, including:  Topical nasal decongestants. They shrink swollen nasal passages and let mucus drain from your sinuses.  Antihistamines. These drugs block inflammation that is triggered by allergies. This can help to ease swelling in your nose and sinuses.  Topical nasal corticosteroids. These are nasal sprays that ease inflammation and swelling in your nose and sinuses.  Nasal saline washes. These rinses can help to get rid of thick mucus in your nose.  If your condition is caused by bacteria, you will be given an antibiotic medicine. If your condition is caused by a fungus, you will be given an antifungal medicine. Surgery may be needed to correct underlying conditions, such as narrow nasal passages. Surgery may also be needed to remove polyps. Follow these instructions at home: Medicines  Take, use, or apply over-the-counter and prescription medicines only as told by your health care provider. These may include nasal sprays.  If you were prescribed an antibiotic medicine, take it as told by your health care provider. Do not stop taking the antibiotic even if you start to feel better. Hydrate and Humidify  Drink enough water to keep your urine clear or pale yellow. Staying hydrated will help to thin your mucus.  Use a cool mist humidifier to keep the humidity level in your home above 50%.  Inhale steam for 10-15 minutes, 3-4 times a day or as told by your health care provider. You can do this in the bathroom while a hot shower is running.  Limit your exposure to cool or dry air. Rest  Rest as much as possible.  Sleep with your head raised (elevated).  Make sure to get enough sleep each night. General instructions  Apply a warm, moist washcloth to your face 3-4 times a day or as told by your health care provider. This will help with discomfort.  Wash your hands often with soap and water to reduce your exposure to viruses and  other germs. If soap and water are not available, use hand sanitizer.  Do not smoke. Avoid being around people who are smoking (secondhand smoke).  Keep all follow-up visits as told by your health care provider. This is important. Contact a health care provider if:  You have a fever.  Your symptoms get worse.  Your symptoms do not improve within 10 days. Get help right away if:  You have a severe headache.  You have persistent vomiting.  You have pain or swelling around your face or eyes.  You have vision problems.  You develop confusion.  Your neck is stiff.  You have trouble breathing. This information is not intended to replace advice given to you by your health care provider. Make sure you discuss any questions you have with your health care provider. Document Released: 04/29/2005 Document Revised: 12/24/2015 Document Reviewed: 02/22/2015 Elsevier Interactive Patient Education  2017 [employer].  IF you received an x-ray today, you will receive an invoice from [facility]. Please contact [facility] at [phone] with questions or concerns regarding your invoice.   IF you received labwork today, you will receive an invoice from [facility]. Please contact [facility] at [phone] with questions or concerns regarding your invoice.   Our billing staff will not be able to assist you with questions regarding bills from these companies.  You will be contacted with the lab results as soon as they are available. The fastest way to get your results is to activate your My Chart account. Instructions are located on the last page of this paperwork. If you have not heard from [place] regarding the results in 2 weeks, please contact this office.

## 2017-02-12 NOTE — Progress Notes (Signed)
Theresa Garza  MRN: [id number] DOB: Aug 31, 1990  PCP: Patient, No Pcp Per  Subjective:  Pt is a 26 year old female who presents to clinic for f/u sinus pressure x >1 month..  She was here one month ago for this same problem. HPI at last OV: "Sinus pain and pressure - this is a chronic problem for her. She has been treated for multiple sinus infections in the past. She recently finished a course of Augmentin. Endorses some runny nose.  Denies fever, chills. Never been evaluated by ENT." She was prescribed Flonase, Claritin-D and referred to ENT. She has an ENT appt on 02/18/2017. She has been taking these medications, and state they are helping some, but her symptoms are still bothering her.   Review of Systems  Constitutional: Negative for chills and fever.  HENT: Positive for sinus pain and sinus pressure. Negative for congestion, facial swelling, postnasal drip and rhinorrhea.   Respiratory: Negative for cough.     Patient Active Problem List   Diagnosis Date Noted  . Esophageal reflux 01/03/2015  . Other headache syndrome 01/03/2015  . CN (constipation) 01/03/2015    Current Outpatient Prescriptions on File Prior to Visit  Medication Sig Dispense Refill  . cyclobenzaprine (FLEXERIL) 5 MG tablet Take 1 tablet (5 mg total) by mouth 3 (three) times daily as needed for muscle spasms. 90 tablet 0  . fluticasone (FLONASE) 50 MCG/ACT nasal spray Place 2 sprays into both nostrils daily. 16 g 6  . folic acid (FOLVITE) 400 MCG tablet Take 400 mcg by mouth daily.    Marland Kitchen omeprazole (PRILOSEC) 20 MG capsule Take 1 capsule (20 mg total) by mouth daily. 30 minutes before breakfast. 30 capsule 0  . ranitidine (ZANTAC) 300 MG tablet Take 1 tablet (300 mg total) by mouth at bedtime. 30 tablet 0  . amitriptyline (ELAVIL) 50 MG tablet Take 50 mg by mouth at bedtime.    Marland Kitchen loratadine-pseudoephedrine (CLARITIN-D 24 HOUR) 10-240 MG 24 hr tablet Take 1 tablet by mouth daily. (Patient not taking: Reported on  02/12/2017) 60 tablet 0  . naproxen (NAPROSYN) 500 MG tablet Take 500 mg by mouth 2 (two) times daily with a meal.    . triamcinolone cream (KENALOG) 0.1 % Apply 1 application topically 2 (two) times daily. Do not apply on face, may use on outside of ear and also on fingers (Patient not taking: Reported on 01/14/2017) 30 g 1   No current facility-administered medications on file prior to visit.     No Known Allergies   Objective:  BP 100/60   Pulse 93   Temp 98.6 F (37 C) (Oral)   Resp 16   Ht  (1.448 m)   Wt 97 lb (44 kg)   LMP 01/29/2017   SpO2 98%   BMI 20.99 kg/m   Physical Exam  Constitutional: She is oriented to person, place, and time and well-developed, well-nourished, and in no distress. No distress.  HENT:  Right Ear: Tympanic membrane normal. No tenderness. No mastoid tenderness.  Left Ear: Tympanic membrane normal. No tenderness. No mastoid tenderness.  Nose: Mucosal edema present. Right sinus exhibits maxillary sinus tenderness. Right sinus exhibits no frontal sinus tenderness. Left sinus exhibits maxillary sinus tenderness. Left sinus exhibits no frontal sinus tenderness.  Mouth/Throat: Uvula is midline, oropharynx is clear and moist and mucous membranes are normal.  Nasal polyps R>L  Cardiovascular: Normal rate, regular rhythm and normal heart sounds.   Neurological: She is alert and oriented to person,  place, and time. GCS score is 15.  Skin: Skin is warm and dry.  Psychiatric: Mood, memory, affect and judgment normal.  Vitals reviewed.   Assessment and Plan :  1. Sinusitis chronic, frontal 2. Frontal sinus pain - amoxicillin-clavulanate (AUGMENTIN) 875-125 MG tablet; Take 1 tablet by mouth 2 (two) times daily.  Dispense: 20 tablet; Refill: 0 - H/o chronic sinus problems - she was here 1 month ago and treated supportively. She has an appt with ENT next week. Encouraged pt to keep appt. Start Augmentin.  Marco Collie, PA-C  Primary Care at [facility] 02/12/2017 2:54 PM

## 2017-05-08 ENCOUNTER — Ambulatory Visit: Payer: Self-pay | Admitting: Physician Assistant

## 2017-05-08 ENCOUNTER — Encounter: Payer: Self-pay | Admitting: Physician Assistant

## 2017-05-08 ENCOUNTER — Other Ambulatory Visit: Payer: Self-pay

## 2017-05-08 VITALS — BP 105/71 | HR 71 | Temp 98.3°F | Resp 16 | Ht <= 58 in | Wt 100.6 lb

## 2017-05-08 DIAGNOSIS — R0981 Nasal congestion: Secondary | ICD-10-CM

## 2017-05-08 DIAGNOSIS — J309 Allergic rhinitis, unspecified: Secondary | ICD-10-CM

## 2017-05-08 MED ORDER — IPRATROPIUM BROMIDE 0.03 % NA SOLN: 2.0000 | Freq: Two times a day (BID) | NASAL | 0 refills | Status: DC

## 2017-05-08 NOTE — Patient Instructions (Addendum)
1) If you have allergies, you should be taking an allergy medication every day. Buy Zyrtec at your pharmacy and start taking this.  2) Use Atrovent nasal spray (from prescription)  2 sprays into both nostrils 2 (two) times daily.  3) See instructions below for nasal rinse. But a neti pot at your pharmacy. Use this daily.   Saline irrigation-Mechanical irrigation with saline may reduce the need for pain medication and improve overall comfort. It is important that irrigants be prepared from sterile or bottled water. (See below for instructions)   SALINE NASAL IRRIGATION  The benefits  1. Saline (saltwater) washes the mucus and irritants from your nose.  2. The sinus passages are moisturized.  3. Studies have also shown that a nasal irrigation improves cell function (the cells that move the mucus work better).  The recipe  Use a one-quart glass jar that is thoroughly cleansed.  You may use a large medical syringe (30 cc), water pick with an irrigation tip (preferred method), squeeze bottle, or Neti pot. Do not use a baby bulb syringe. The syringe or pick should be sterilized frequently or replaced every two to three weeks to avoid contamination and infection.  Fill with water that has been distilled, previously boiled, or otherwise sterilized. Plain tap water is not recommended, because it is not necessarily sterile.  Add 1 to 1 heaping teaspoons of pickling/canning salt. Do not use table salt, because it contains a large number of additives.  Add 1 teaspoon of baking soda (pure bicarbonate).  Mix ingredients together, and store at room temperature. Discard after one week.  You may also make up a solution from premixed packets that are commercially prepared specifically for nasal irrigation.  The instructions  Irrigate your nose with saline one to two times per day.   If you have been told to use nasal medication, you should always use your saline solution first. The nasal medication is much  more effective when sprayed onto clean nasal membranes, and the spray will reach deeper into the nose.   Pour the amount of fluid you plan to use into a clean bowl. Do not put your used syringe back into the storage container, because it contaminates your solution.   You may warm the solution slightly in the microwave, but be sure that the solution is not hot.   Bend over the sink (some people do this in the shower), and squirt the solution into each side of your nose, aiming the stream toward the back of your head, not the top of your head. The solution should flow into one nostril and out of the other, but it will not harm you if you swallow a little.   Some people experience a little burning sensation the first few times that they use buffered saline solution, but this usually goes away after they adapt to it.    Allergies, Adult An allergy is when your body's defense system (immune system) overreacts to an otherwise harmless substance (allergen) that you breathe in or eat or something that touches your skin. When you come into contact with something that you are allergic to, your immune system produces certain proteins (antibodies). These proteins cause cells to release chemicals (histamines) that trigger the symptoms of an allergic reaction. Allergies often affect the nasal passages (allergic rhinitis), eyes (allergic conjunctivitis), skin (atopic dermatitis), and stomach. Allergies can be mild or severe. Allergies cannot spread from person to person (are not contagious). They can develop at any age and 24may  be outgrown. What increases the risk? You may be at greater risk of allergies if other people in your family have allergies.  How is this treated? Treatment for allergies depends on your symptoms. Treatment may include:  Cold compresses to soothe itching and swelling.  Eye drops.  Nasal sprays.  Using a saline spray or container (neti pot) to flush out the nose (nasal irrigation).  These methods can help clear away mucus and keep the nasal passages moist.  Using a humidifier.  Oral antihistamines or other medicines to block allergic reaction and inflammation.  Skin creams to treat rashes or itching.  Diet changes to eliminate food allergy triggers.  Repeated exposure to tiny amounts of allergens to build up a tolerance and prevent future allergic reactions (immunotherapy). These include: ? Allergy shots. ? Oral treatment. This involves taking small doses of an allergen under the tongue (sublingual immunotherapy).  Emergency epinephrine injection (auto-injector) in case of an allergic emergency. This is a self-injectable, pre-measured medicine that must be given within the first few minutes of a serious allergic reaction.  Follow these instructions at home:  Avoid known allergens whenever possible.  If you suffer from airborne allergens, wash out your nose daily. You can do this with a saline spray or a neti pot to flush out your nose (nasal irrigation).  Take over-the-counter and prescription medicines only as told by your health care provider.  Keep all follow-up visits as told by your health care provider. This is important.  If you are at risk of a severe allergic reaction (anaphylaxis), keep your auto-injector with you at all times.  If you have ever had anaphylaxis, wear a medical alert bracelet or necklace that states you have a severe allergy. Contact a health care provider if:  Your symptoms do not improve with treatment. Get help right away if:  You have symptoms of anaphylaxis, such as: ? Swollen mouth, tongue, or throat. ? Pain or tightness in your chest. ? Trouble breathing or shortness of breath. ? Dizziness or fainting. ? Severe abdominal pain, vomiting, or diarrhea. This information is not intended to replace advice given to you by your health care provider. Make sure you discuss any questions you have with your health care  provider. Document Released: 07/23/2002 Document Revised: 08/28/2016 Document Reviewed: 11/15/2015 Elsevier Interactive Patient Education  2018 [employer]  IF you received an x-ray today, you will receive an invoice from [facility]. Please contact [facility] at [phone] with questions or concerns regarding your invoice.   IF you received labwork today, you will receive an invoice from [facility]. Please contact [facility] at [phone] with questions or concerns regarding your invoice.   Our billing staff will not be able to assist you with questions regarding bills from these companies.  You will be contacted with the lab results as soon as they are available. The fastest way to get your results is to activate your My Chart account. Instructions are located on the last page of this paperwork. If you have not heard from us regarding the results in 2 weeks, please contact this office.

## 2017-05-08 NOTE — Progress Notes (Signed)
Theresa Garza  MRN: [id number] DOB: 07/01/1990  PCP: Patient, No Pcp Per  Subjective:  Pt is a 26 year old female who presents to clinic for nasal congestion x 1 week. She has been taking Sudafed and Flonase which is not helping. Endorses nasal drainage and cough.  Denies fever, chills, sinus pressure, sinus pain, ear pain.   Review of Systems  Constitutional: Negative for chills, diaphoresis, fatigue and fever.  HENT: Positive for congestion and rhinorrhea. Negative for postnasal drip, sinus pressure, sinus pain and sore throat.   Respiratory: Positive for cough. Negative for shortness of breath and wheezing.   Psychiatric/Behavioral: Negative for sleep disturbance.    Patient Active Problem List   Diagnosis Date Noted  . Esophageal reflux 01/03/2015  . Other headache syndrome 01/03/2015  . CN (constipation) 01/03/2015    Current Outpatient Medications on File Prior to Visit  Medication Sig Dispense Refill  . fluticasone (FLONASE) 50 MCG/ACT nasal spray Place 2 sprays into both nostrils daily. 16 g 6  . folic acid (FOLVITE) 400 MCG tablet Take 400 mcg by mouth daily.    Marland Kitchen. amitriptyline (ELAVIL) 50 MG tablet Take 50 mg by mouth at bedtime.    . cyclobenzaprine (FLEXERIL) 5 MG tablet Take 1 tablet (5 mg total) by mouth 3 (three) times daily as needed for muscle spasms. (Patient not taking: Reported on 05/08/2017) 90 tablet 0  . loratadine-pseudoephedrine (CLARITIN-D 24 HOUR) 10-240 MG 24 hr tablet Take 1 tablet by mouth daily. (Patient not taking: Reported on 02/12/2017) 60 tablet 0  . naproxen (NAPROSYN) 500 MG tablet Take 500 mg by mouth 2 (two) times daily with a meal.    . omeprazole (PRILOSEC) 20 MG capsule Take 1 capsule (20 mg total) by mouth daily. 30 minutes before breakfast. (Patient not taking: Reported on 05/08/2017) 30 capsule 0  . ranitidine (ZANTAC) 300 MG tablet Take 1 tablet (300 mg total) by mouth at bedtime. (Patient not taking: Reported on 05/08/2017) 30 tablet 0  .  triamcinolone cream (KENALOG) 0.1 % Apply 1 application topically 2 (two) times daily. Do not apply on face, may use on outside of ear and also on fingers (Patient not taking: Reported on 01/14/2017) 30 g 1   No current facility-administered medications on file prior to visit.     No Known Allergies   Objective:  BP 105/71   Pulse 71   Temp 98.3 F (36.8 C) (Oral)   Resp 16   Ht 4\' 10"  (1.473 m)   Wt 100 lb 9.6 oz (45.6 kg)   LMP 05/06/2017   SpO2 95%   BMI 21.03 kg/m   Physical Exam  Constitutional: She is oriented to person, place, and time and well-developed, well-nourished, and in no distress. No distress.  HENT:  Right Ear: Tympanic membrane normal.  Left Ear: Tympanic membrane normal.  Nose: Mucosal edema present. No rhinorrhea. Right sinus exhibits no maxillary sinus tenderness and no frontal sinus tenderness. Left sinus exhibits no maxillary sinus tenderness and no frontal sinus tenderness.  Mouth/Throat: Oropharynx is clear and moist and mucous membranes are normal.  Cardiovascular: Normal rate, regular rhythm and normal heart sounds.  Pulmonary/Chest: Effort normal and breath sounds normal. No respiratory distress. She has no wheezes. She has no rales.  Neurological: She is alert and oriented to person, place, and time. GCS score is 15.  Skin: Skin is warm and dry.  Psychiatric: Mood, memory, affect and judgment normal.  Vitals reviewed.   Assessment and Plan :  1. Allergic rhinitis, unspecified seasonality, unspecified trigger 2. Nasal congestion - ipratropium (ATROVENT) 0.03 % nasal spray; Place 2 sprays into both nostrils 2 (two) times daily.  Dispense: 30 mL; Refill: 0 - Suspect nasal drainage 2/2 allergies. Plan to treat supportively. Start taking Zyrtec daily. She has been evaluated by ENT in the past. RTC PRN.   Marco CollieWhitney Aldean Pipe, PA-C  Primary [facility] at [facility] 05/08/2017 10:17 AM

## 2017-05-30 ENCOUNTER — Ambulatory Visit: Payer: Self-pay

## 2017-05-30 NOTE — Telephone Encounter (Signed)
  Reason for Disposition . [1] Pus on tonsils (back of throat) AND [2]  fever AND [3] swollen neck lymph nodes ("glands")  Answer Assessment - Initial Assessment Questions 1. ONSET: "When did the throat start hurting?" (Hours or days ago)      Started 3 days 2. SEVERITY: "How bad is the sore throat?" (Scale 1-10; mild, moderate or severe)   - MILD (1-3):  doesn't interfere with eating or normal activities   - MODERATE (4-7): interferes with eating some solids and normal activities   - SEVERE (8-10):  excruciating pain, interferes with most normal activities   - SEVERE DYSPHAGIA: can't swallow liquids, drooling     Moderate 3. STREP EXPOSURE: "Has there been any exposure to strep within the past week?" If so, ask: "What type of contact occurred?"      No 4.  VIRAL SYMPTOMS: "Are there any symptoms of a cold, such as a runny nose, cough, hoarse voice or red eyes?"      Runny nose 5. FEVER: "Do you have a fever?" If so, ask: "What is your temperature, how was it measured, and when did it start?"     Feels like she has a fever - no thermometer 6. PUS ON THE TONSILS: "Is there pus on the tonsils in the back of your throat?"     Maybe 7. OTHER SYMPTOMS: "Do you have any other symptoms?" (e.g., difficulty breathing, headache, rash)     Chest pain when she sneezes 8. PREGNANCY: "Is there any chance you are pregnant?" "When was your last menstrual period?"     No  Protocols used: SORE THROAT-A-AH

## 2017-05-31 ENCOUNTER — Encounter: Payer: Self-pay | Admitting: Physician Assistant

## 2017-05-31 ENCOUNTER — Other Ambulatory Visit: Payer: Self-pay

## 2017-05-31 ENCOUNTER — Ambulatory Visit: Payer: BLUE CROSS/BLUE SHIELD | Admitting: Physician Assistant

## 2017-05-31 VITALS — BP 102/64 | HR 100 | Temp 97.7°F | Ht 58.27 in | Wt 95.0 lb

## 2017-05-31 DIAGNOSIS — J029 Acute pharyngitis, unspecified: Secondary | ICD-10-CM

## 2017-05-31 LAB — POCT RAPID STREP A (OFFICE): Rapid Strep A Screen: NEGATIVE

## 2017-05-31 MED ORDER — HYDROCODONE-HOMATROPINE 5-1.5 MG/5ML PO SYRP: 5.0000 mL | ORAL_SOLUTION | Freq: Three times a day (TID) | ORAL | 0 refills | Status: DC | PRN

## 2017-05-31 MED ORDER — BENZOCAINE-MENTHOL 15-4 MG MT LOZG: 1.0000 | LOZENGE | Freq: Four times a day (QID) | OROMUCOSAL | 0 refills | Status: DC

## 2017-05-31 NOTE — Progress Notes (Signed)
   Theresa Garza  MRN: [id number] DOB: 07/25/1990  PCP: Patient, No Pcp Per  Subjective:  Pt is a 27 year old female who presents to clinic for sore throat x 3 days. She endorses ear aches, fever, runny nose and hoarse voice.  She took one dose of Advil, not helping. She is not sleeping well due to symptoms.   Review of Systems  Constitutional: Negative for chills, diaphoresis, fatigue and fever.  HENT: Positive for congestion, rhinorrhea, sore throat and voice change. Negative for postnasal drip, sinus pressure and sinus pain.   Respiratory: Negative for cough, shortness of breath and wheezing.   Psychiatric/Behavioral: Negative for sleep disturbance.    Patient Active Problem List   Diagnosis Date Noted  . Esophageal reflux 01/03/2015  . Other headache syndrome 01/03/2015  . CN (constipation) 01/03/2015    Current Outpatient Medications on File Prior to Visit  Medication Sig Dispense Refill  . folic acid (FOLVITE) 400 MCG tablet Take 400 mcg by mouth daily.     No current facility-administered medications on file prior to visit.     No Known Allergies   Objective:  BP 102/64 (BP Location: Left Arm, Patient Position: Sitting, Cuff Size: Normal)   Pulse 100   Temp 97.7 F (36.5 C) (Oral)   Ht 4' 10.27" (1.48 m)   Wt 95 lb (43.1 kg)   LMP 05/06/2017   SpO2 99%   BMI 19.67 kg/m   Physical Exam  Constitutional: She is oriented to person, place, and time and well-developed, well-nourished, and in no distress. No distress.  HENT:  Right Ear: Tympanic membrane normal.  Left Ear: Tympanic membrane normal.  Nose: Mucosal edema present. No rhinorrhea. Right sinus exhibits no maxillary sinus tenderness and no frontal sinus tenderness. Left sinus exhibits no maxillary sinus tenderness and no frontal sinus tenderness.  Mouth/Throat: Oropharynx is clear and moist and mucous membranes are normal.  Cardiovascular: Normal rate, regular rhythm and normal heart sounds.  Pulmonary/Chest:  Effort normal and breath sounds normal. No respiratory distress. She has no wheezes. She has no rales.  Lymphadenopathy:       Head (right side): Tonsillar adenopathy present. No submandibular adenopathy present.       Head (left side): Tonsillar adenopathy present. No submandibular adenopathy present.    She has cervical adenopathy.       Right cervical: Superficial cervical adenopathy present.       Left cervical: Superficial cervical adenopathy present.  Neurological: She is alert and oriented to person, place, and time. GCS score is 15.  Skin: Skin is warm and dry.  Psychiatric: Mood, memory, affect and judgment normal.  Vitals reviewed.   Results for orders placed or performed in visit on 05/31/17  POCT rapid strep A  Result Value Ref Range   Rapid Strep A Screen Negative Negative    Assessment and Plan :  1. Sore throat - POCT rapid strep A - Culture, Group A Strep - HYDROcodone-homatropine (HYCODAN) 5-1.5 MG/5ML syrup; Take 5 mLs by mouth every 8 (eight) hours as needed for cough.  Dispense: 120 mL; Refill: 0 - Benzocaine-Menthol 15-4 MG LOZG; Use as directed 1 lozenge in the mouth or throat 4 (four) times daily.  Dispense: 9 lozenge; Refill: 0 - Rapid strep is negative. Culture is pending. Advised supportive care. RTC in 5-7 days if no improvement.    Marco CollieWhitney Solmon Bohr, PA-C  [facility] at [facility] 05/31/2017 3:32 PM

## 2017-05-31 NOTE — Patient Instructions (Addendum)
Your rapid strep test is negative. We will contact you if your strep culture grows bacteria and you need antibiotics.  Your sore throat is likely due to a viral infection. Your symptoms should start to improve in 3-5 days. You can help your symptoms with supportive care (see below):  Hycodan syrup will soothe your throat and help you sleep. Take this as directed.  Cepacol (at your pharmacy) will help numb your throat.  For sore throat: ? Gargle with 8 oz of salt water ( tsp of salt per 1 qt of water) as often as every 1-2 hours to soothe your throat.  Gargle liquid benadryl.   Stay well hydrated. Get lost of rest. Wash your hands often.   -Foods that can help speed recovery: honey, garlic, chicken soup, elderberries, green tea.  -Supplements that can help speed recovery: vitamin C, zinc, elderberry extract, quercetin, ginseng, selenium -Supplement with prebiotics and probiotics:   Advil or ibuprofen for pain. Do not take Aspirin.  Drink enough water and fluids to keep your urine clear or pale yellow. Use flonase nasal spray for runny nose.   For sore throat try using a honey-based tea. Use 3 teaspoons of honey with juice squeezed from half lemon. Place shaved pieces of ginger into 1/2-1 cup of water and warm over stove top. Then mix the ingredients and repeat every 4 hours as needed.  Cough Syrup Recipe: Sweet Lemon & Honey Thyme  Ingredients a handful of fresh thyme sprigs   1 pint of water (2 cups)  1/2 cup honey (raw is best, but regular will do)  1/2 lemon chopped Instructions 1. Place the lemon in the pint jar and cover with the honey. The honey will macerate the lemons and draw out liquids which taste so delicious! 2. Meanwhile, toss the thyme leaves into a saucepan and cover them with the water. 3. Bring the water to a gentle simmer and reduce it to half, about a cup of tea. 4. When the tea is reduced and cooled a bit, strain the sprigs & leaves, add it into the pint jar and  stir it well. 5. Give it a shake and use a spoonful as needed. 6. Store your homemade cough syrup in the refrigerator for about a month.  Thank you for coming in today. I hope you feel we met your needs.  Feel free to call PCP if you have any questions or further requests.  Please consider signing up for MyChart if you do not already have it, as this is a great way to communicate with me.  Best,  Whitney McVey, PA-C   IF you received an x-ray today, you will receive an invoice from [facility]. Please contact [facility] at [phone] with questions or concerns regarding your invoice.   IF you received labwork today, you will receive an invoice from [facility]. Please contact [facility] at [phone] with questions or concerns regarding your invoice.   Our billing staff will not be able to assist you with questions regarding bills from these companies.  You will be contacted with the lab results as soon as they are available. The fastest way to get your results is to activate your My Chart account. Instructions are located on the last page of this paperwork. If you have not heard from [place] regarding the results in 2 weeks, please contact this office.

## 2017-06-02 ENCOUNTER — Ambulatory Visit: Payer: Self-pay | Admitting: Physician Assistant

## 2017-06-03 LAB — CULTURE, GROUP A STREP: Strep A Culture: NEGATIVE

## 2017-06-16 ENCOUNTER — Other Ambulatory Visit: Payer: Self-pay | Admitting: Physician Assistant

## 2017-06-16 DIAGNOSIS — J029 Acute pharyngitis, unspecified: Secondary | ICD-10-CM

## 2017-06-23 ENCOUNTER — Ambulatory Visit: Payer: BLUE CROSS/BLUE SHIELD | Admitting: Family Medicine

## 2017-06-23 ENCOUNTER — Ambulatory Visit: Payer: BLUE CROSS/BLUE SHIELD | Admitting: Physician Assistant

## 2017-06-23 ENCOUNTER — Ambulatory Visit (INDEPENDENT_AMBULATORY_CARE_PROVIDER_SITE_OTHER): Payer: BLUE CROSS/BLUE SHIELD

## 2017-06-23 ENCOUNTER — Other Ambulatory Visit: Payer: Self-pay

## 2017-06-23 ENCOUNTER — Encounter: Payer: Self-pay | Admitting: Family Medicine

## 2017-06-23 VITALS — BP 102/62 | HR 84 | Temp 98.5°F | Ht 62.6 in | Wt 96.2 lb

## 2017-06-23 DIAGNOSIS — R059 Cough, unspecified: Secondary | ICD-10-CM

## 2017-06-23 DIAGNOSIS — R05 Cough: Secondary | ICD-10-CM | POA: Diagnosis not present

## 2017-06-23 DIAGNOSIS — J0100 Acute maxillary sinusitis, unspecified: Secondary | ICD-10-CM

## 2017-06-23 MED ORDER — FLUTICASONE PROPIONATE 50 MCG/ACT NA SUSP: 1.0000 | Freq: Two times a day (BID) | NASAL | 6 refills | Status: DC

## 2017-06-23 MED ORDER — AMOXICILLIN-POT CLAVULANATE 875-125 MG PO TABS: 1.0000 | ORAL_TABLET | Freq: Two times a day (BID) | ORAL | 0 refills | Status: DC

## 2017-06-23 NOTE — Patient Instructions (Addendum)
1. Get from over the counter a nasal saline wash to use 2-3 times a day and an oral decongestant such as phenylephrine. Use the nasal saline washes before using the flonase as you do not want to wash out the medication.    Vim xoang, Ng??i l?n Sinusitis, Adult Vim xoang l hi?n t??ng ?au nh?c v vim ? cc xoang m?i. Xoang la? nh??ng khoa?ng tr?ng nng trong x??ng quanh m??t quy? vi?. Cc xoang n??m ??:  Xung quanh m?t.  ?? gi??a tra?n.  Phi?a sau mu?i.  ?? x??ng go? ma? cu?a quy? vi?.  Xoang va? h?c mu?i co? n?p nh?n ch??a ch?t lo?ng ???c qua?nh (di?ch nh?y). Di?ch nh?y th???ng ti?t ra t?? xoang cu?a quy? vi?. Khi m mu?i cu?a quy? vi? bi? vim ho??c s?ng, di?ch nh?y co? th? bi? ke?t la?i ho??c t??c nn khng khi? khng th? ?i qua ca?c xoang cu?a quy? vi?. Ti?nh tra?ng na?y cho phe?p vi khu?n, vi ru?t va? n?m pha?t tri?n, ?i?u na?y d?n ??n nhi?m tru?ng. Vim xoang co? th? pha?t tri?n nhanh cho?ng va? ke?o da?i 7?10 nga?y (c?p ti?nh) ho??c h?n 12 tu?n (ma?n ti?nh). Vim xoang co? th? pha?t tri?n sau khi bi? ca?m la?nh. Nguyn nhn g gy ra? Ti?nh tra?ng na?y co? th? xa?y ra do b?t ky? nguyn nhn gi? la?m s?ng bn trong ca?c xoang ho??c ng?n khng cho d?ch nh?y ch?y ra, bao g?m:  D? ?ng.  Hen suy?n.  Nhi?m vi khu?n ho??c vi ru?t.  Ca?c x??ng co? hi?nh da?ng b?t th???ng gi??a ca?c h?c mu?i.  Kh?i u ?? mu?i co? ch??a di?ch nh?y (polip mu?i).  L? xoang he?p.  Ca?c ch?t gy  nhi?m, ch??ng ha?n nh? ho?a ch?t ho??c cc ch?t ki?ch thi?ch trong khng khi?Marland Kitchen  M?t di? v?t t??c trong mu?i.  Nhi?m n?m. V?n ?? ny hi?m khi x?y ra.  ?i?u g lm t?ng nguy c?? Nh?ng y?u t? sau c th? lm qu v? d? b? tnh tr?ng ny h?n:  Bi? di? ??ng ho??c hen suy?n.  G?n ?y b? ca?m la?nh ho??c nhi?m trng ???ng h h?p.  Co? bi?n da?ng c?u trc ho??c t??c trong mu?i ho??c trong xoang.  C h? mi?n d?ch b? y?u.  B?i ho??c l??n nhi?u.  Du?ng qua? nhi?u  thu?c xi?t mu?i.  Ht thu?c.  Cc d?u hi?u ho?c tri?u ch?ng l g? Cc tri?u ch?ng chnh cu?a ti?nh tra?ng na?y l ?au v c?m gic n?ng xung quanh cc xoang b? ?nh h??ng. Cc tri?u ch?ng khc bao g?m:  ?au r?ng hm trn.  ?au tai.  ?au ??u.  H?i th? hi.  Suy gi?m thnh gic v v? gic.  Ho co? th? nhi?u h?n va?o ban ?m.  M?t m?i.  S?t.  Ch?y di?ch ??c ?? mu?i quy? vi?. D?ch ch?y ra th??ng c mu xanh v c th? c m? (m?).  Nghe?t m?i ho?c sung huy?t.  S? mu?i pha sau. ?o? la? khi co? thm di?ch nh?y ti?ch tu? ?? ho?ng ho??c ?? thnh sau mu?i.  S?ng v ?m h?n ? cc xoang b? ?nh h??ng.  ?au h?ng.  Nh?y c?m v?i nh sng.  Ch?n ?on tnh tr?ng ny nh? th? no? Tnh tr?ng ny c th? ???c ch?n ?on d?a [place] tri?u ch??ng, khai thc b?nh s? v khm th?c th?. ?? ti?m hi?u xem ti?nh tra?ng na?y la? c?p ti?nh hay ma?n ti?nh, chuyn gia ch?m so?c s??c kho?e cu?a quy? vi? co? th?:  Nhi?n va?o mu?i quy? vi? xem co? d?u hi?u polip mu?i  hay khng.  [place] cc xoang b? ?nh h??ng ?? ki?m tra d?u hi?u nhi?m trng.  Khm bn trong xoang c?a qu v? b?ng cch s? d?ng m?t thi?t b? t?o ?nh c g?n ?n ? ??u (n?i soi).  N?u chuyn gia ch?m so?c s??c kho?e cu?a quy? vi? nghi ng?? quy? vi? bi? vim xoang ma?n ti?nh, quy? vi? cu?ng co? th?:  ???c ki?m tra di? ??ng.  L?y m?u di?ch nh?y ?? mu?i (c?y di?ch nh?y mu?i) va? ki?m tra xem co? vi khu?n khng.  Cho ki?m tra m?u di?ch nh?y ?? xem b?nh vim xoang cu?a quy? vi? co? lin quan ??n di? ??ng hay khng.  N?u b?nh vim xoang cu?a quy? vi? khng ?a?p ??ng v??i ?i?u tri? va? no? ke?o da?i h?n 8 tu?n, quy? vi? co? th? ???c chu?p MRI ho??c CT ?? ki?m tra xoang cu?a quy? vi?. Vi?c chu?p na?y cu?ng giu?p xa?c ?i?nh nhi?m tru?ng cu?a quy? vi? n?ng ??n m?c na?o. Trong ca?c tr???ng h??p hi?m g??p, quy? vi? co? th? c?n la?m sinh thi?t x??ng ?? loa?i tr? nh??ng loa?i b?nh xoang do nhi?m n?m nghim tro?ng h?n. Tnh  tr?ng ny ???c ?i?u tr? nh? th? no? Vi?c ?i?u tri? vim xoang tu?y thu?c va?o nguyn nhn va? ti?nh tra?ng cu?a quy? vi? la? ma?n ti?nh hay c?p ti?nh. N?u vim xoang cu?a quy? vi? do vi ru?t gy ra, ca?c tri?u ch??ng cu?a quy? vi? se? t?? h?t trong [place]?ng 10 nga?y. Quy? vi? cu?ng co? th? ????c cho du?ng thu?c ?? gia?m nhe? ca?c tri?u ch??ng, bao g?m:  Thu?c ch?ng sung huy?t mu?i t?i ch?. Thu?c na?y la?m ca?c h?c mu?i s?ng co la?i va? ?? cho di?ch nh?y ch?y ra kh?i ca?c xoang.  Thu?c khng histamine. Nh??ng thu?c na?y ng?n ti?nh tra?ng vim do di? ??ng gy nn. Vi?c na?y co? th? giu?p gia?m s?ng ?? mu?i va? xoang cu?a quy? vi?.  Cc thu?c corticosteroid xi?t mu?i t?i ch?. ?y la? thu?c xi?t mu?i la?m gia?m vim va? gi?m s?ng ?? mu?i va? xoang cu?a quy? vi?.  N???c mu?i sinh l r??a mu?i. Nh??ng lo?i n???c r??a na?y co? th? giu?p loa?i bo? di?ch nh?y ???c trong mu?i quy? vi?.  N?u ti?nh tra?ng cu?a quy? vi? do vi khu?n gy ra, quy? vi? se? ????c cho du?ng thu?c kha?ng sinh. N?u ti?nh tra?ng cu?a quy? vi? do n?m gy ra, quy? vi? se? ????c cho du?ng thu?c kha?ng n?m. Ph?u thu?t co? th? c?n thi?t ?? ?i?u tri? tnh tr?ng b?nh ly? lin quan, ch??ng ha?n nh? he?p h?c mu?i. Ph?u thu?t cu?ng co? th? c?n thi?t ?? c??t bo? polip. Tun th? nh?ng h??ng d?n ny ? nh: Thu?c  Ch? u?ng, dng ho?c bi thu?c khng k ??n v thu?c k ??n theo ch? d?n c?a chuyn gia ch?m [place] s?c kh?e. Nh??ng thu?c na?y bao g?m ca? thu?c xi?t mu?i.  N?u qu v? ???c k thu?c khng sinh, hy dng thu?c theo ch? d?n c?a chuyn gia ch?m [place] s?c kh?e. Khng d?ng u?ng thu?c khng sinh ngay c? khi qu v? b?t ??u c?m th?y ?? h?n. U?ng ?u? n???c va? la?m ?m  U?ng ?? n??c ?? n??c ti?u trong ho?c c mu vng nh?t. Gi?? cho c? th? ?u? n???c se? giu?p la?m loa?ng di?ch nh?y.  S?? du?ng m?t ma?y ta?o s??ng mu? la?m ?m ma?t ?? gi?? ?? ?m trong nha? quy? vi? ?? m??c trn 50%.  Hi?t h?i n???c  trong 10-15 phu?t, 3-4 m?i nga?y ho??c theo chi? d?n cu?a chuyn gia  ch?m so?c s??c kho?e. Quy? vi? c th? la?m vi?c na?y trong pho?ng t??m khi [place]?i sen n???c no?ng ?ang cha?y.  Ha?n ch? ti?p xu?c v??i khng khi? la?nh ho??c kh. Ngh? ng?i  Ngh? ng?i cng nhi?u cng t?t.  Ngu? g?i ??u cao (nng cao).  Ba?o ?a?m ngu? ?u? gi?c va?o m?i ?m. H??ng d?n chung  Ch??m kh?n ?m, ?m ln m?t 3-4 l?n m?i ngy ho?c theo ch? d?n c?a chuyn gia ch?m [place] s?c kh?e. Vi?c na?y se? giu?p gia?m c?m gic kho? chi?u.  R??a tay th???ng xuyn b??ng xa? pho?ng va? n???c ?? gia?m ti?p xu?c v??i vi ru?t va? ca?c vi tru?ng kha?c. N?u khng c x phng v n??c, hy dng thu?c st trng tay.  Khng ht thu?c. Tra?nh ?? ca?nh nh??ng ng???i hu?t thu?c (kho?i thu?c thu? ??ng).  Tun th? t?t c? cc l?n khm theo di theo ch? d?n c?a chuyn gia ch?m [place] s?c kh?e. ?i?u ny c vai tr quan tr?ng. Hy lin l?c v?i chuyn gia ch?m [place] s?c kh?e n?u:  Qu v? b? s?t.  Tri?u ch?ng c?a qu v? tr?m tr?ng h?n.  Tri?u ch?ng c?a qu v? khng c?i thi?n trong 10 ngy. Yu c?u tr? gip ngay l?p t?c n?u:  Qu v? b? ?au ??u nhi?u.  Qu v? lin t?c nn m?a.  Qu v? b? ?au ho??c s?ng xung quanh m?t ho??c m??t.  Qu v? c v?n ?? v? th? l?c.  Qu v? b? l l?n.  C? qu v? b? c?ng.  Qu v? b? kh th?. Thng tin ny khng nh?m m?c ?ch thay th? cho l?i khuyn m chuyn gia ch?m [place] s?c kh?e ni v?i qu v?. Hy b?o ??m qu v? ph?i th?o lu?n b?t k? v?n ?? g m qu v? c v?i chuyn gia ch?m [place] s?c kh?e c?a qu v?. Document Released: 10/29/2011 Document Revised: 08/15/2016 Document Reviewed: 02/22/2015 Elsevier Interactive Patient Education  2018 [employer].     IF you received an x-ray today, you will receive an invoice from [facility]. Please contact [facility] at [phone] with questions or concerns regarding your invoice.   IF you received labwork today, you will receive an invoice  from [facility]. Please contact [facility] at [phone] with questions or concerns regarding your invoice.   Our billing staff will not be able to assist you with questions regarding bills from these companies.  You will be contacted with the lab results as soon as they are available. The fastest way to get your results is to activate your My Chart account. Instructions are located on the last page of this paperwork. If you have not heard from [place] regarding the results in 2 weeks, please contact this office.

## 2017-06-23 NOTE — Progress Notes (Signed)
2/11/201910:12 AM  Theresa Garza 10/29/1990, 27 y.o. female [id number]  Chief Complaint  Patient presents with  . Nasal Congestion    coughing and back pain    HPI:   Patient is a 27 y.o. female who presents today for 3 weeks of worsening cough, mildly productive, white sputum, sometimes blood tinged if she has been coughing a lot, with nasal congestion, sinus pressure, she has been coughing so much that her chest and back hurt. At times she feels mildly SOB, she reports subjective fevers. Has not taken ant medications today other than a nasal spray. Does not know its name. Denies any abd pain, nausea, vomiting, diarrhea or rashes.   [facility] 06/23/2017 05/31/2017 05/08/2017  Decreased Interest 0 0 0  Down, Depressed, Hopeless 0 0 0  PHQ - 2 Score 0 0 0    No Known Allergies  Prior to Admission medications   Medication Sig Start Date End Date Taking? Authorizing Provider  folic acid (FOLVITE) 400 MCG tablet Take 400 mcg by mouth daily.    [provider]    Past Medical History:  Diagnosis Date  . Allergy     History reviewed. No pertinent surgical history.  Social History   Tobacco Use  . Smoking status: Never Smoker  . Smokeless tobacco: Never Used  Substance Use Topics  . Alcohol use: No    History reviewed. No pertinent family history.  ROS Per hpi  OBJECTIVE:  Blood pressure 102/62, pulse 84, temperature 98.5 F (36.9 C), temperature source Oral, height 5' 2.6" (1.59 m), weight 96 lb 3.2 oz (43.6 kg), last menstrual period 06/09/2017, SpO2 96 %.  Physical Exam  Constitutional: She is oriented to person, place, and time and well-developed, well-nourished, and in no distress.  HENT:  Head: Normocephalic and atraumatic.  Right Ear: Hearing, tympanic membrane, external ear and ear canal normal.  Left Ear: Hearing, tympanic membrane, external ear and ear canal normal.  Nose: Mucosal edema and rhinorrhea present. Right sinus exhibits  maxillary sinus tenderness. Left sinus exhibits maxillary sinus tenderness.  Mouth/Throat: Oropharynx is clear and moist.  Eyes: EOM are normal. Pupils are equal, round, and reactive to light.  Neck: Neck supple.  Cardiovascular: Normal rate, regular rhythm and normal heart sounds. Exam reveals no gallop and no friction rub.  No murmur heard. Pulmonary/Chest: Effort normal and breath sounds normal. She has no wheezes. She has no rales.  Lymphadenopathy:    She has no cervical adenopathy.  Neurological: She is alert and oriented to person, place, and time. Gait normal.  Skin: Skin is warm and dry.     Dg Chest 2 View  Result Date: 06/23/2017 CLINICAL DATA:  Worsening cough for 3 weeks, subjective fever EXAM: CHEST  2 VIEW COMPARISON:  None. FINDINGS: The heart size and mediastinal contours are within normal limits. Both lungs are clear. The visualized skeletal structures are unremarkable. IMPRESSION: No active cardiopulmonary disease. Electronically Signed   By: Judie PetitM.  Shick M.D.   On: 06/23/2017 10:58     ASSESSMENT and PLAN 1. Acute non-recurrent maxillary sinusitis Discussed supportive measures, new meds r/se/b and RTC precautions. Patient educational handout given.  2. Cough - DG Chest 2 View; Future  Other orders - amoxicillin-clavulanate (AUGMENTIN) 875-125 MG tablet; Take 1 tablet by mouth 2 (two) times daily. - fluticasone (FLONASE) 50 MCG/ACT nasal spray; Place 1 spray into both nostrils 2 (two) times daily.  Return if symptoms worsen or fail to improve.    Theresa Garza  Reubin Milan, MD [facility] [place], [place] [address] Ph.  [phone] Fax [phone]

## 2017-06-24 ENCOUNTER — Telehealth: Payer: Self-pay | Admitting: Family Medicine

## 2017-06-24 ENCOUNTER — Ambulatory Visit: Payer: Self-pay | Admitting: *Deleted

## 2017-06-24 MED ORDER — DOXYCYCLINE HYCLATE 100 MG PO TABS: 100.0000 mg | ORAL_TABLET | Freq: Two times a day (BID) | ORAL | 0 refills | Status: DC

## 2017-06-24 NOTE — Telephone Encounter (Signed)
Duplicate message. 

## 2017-06-24 NOTE — Telephone Encounter (Signed)
New rx sent

## 2017-06-24 NOTE — Telephone Encounter (Signed)
Patient stated she was put on the antibiotic amoxicillin-clavulanate yesterday and took 2 doses. She is now having vomiting and stomach pains. She wanted to know if something else could be called in to her. She is out of town right now.  The pharmacy where she is   [facility]  [address] [place] [id number] Phone - [phone] She is waiting for a call back please.

## 2017-06-24 NOTE — Telephone Encounter (Signed)
Pt is having a reaction from her antibiotic, amoxicillin-clavulanate. She states she took 2 doses of it and started having stomach pains and vomiting. She is still having some pain now. States her pain is a 9. No other symptoms. She wanted to know if she could be put on a different antibiotic. She is out of town right now.  Will like a call back please.  Reason for Disposition . Caller has URGENT medication question about med that PCP prescribed and triager unable to answer question  Answer Assessment - Initial Assessment Questions 1. SYMPTOMS: "Do you have any symptoms?"     Vomiting and abdominal pain after starting an antibiotic 2. SEVERITY: If symptoms are present, ask "Are they mild, moderate or severe?"     severe  Protocols used: MEDICATION QUESTION CALL-A-AH

## 2017-06-24 NOTE — Telephone Encounter (Signed)
Please let patient that I am sorry she had side effects from augmentin, I did send a prescription for doxycycline, hope she does well. thanks

## 2017-06-24 NOTE — Addendum Note (Signed)
Addended by: Koren ShiverSANTIAGO, Tiesha Marich M on: 06/24/2017 01:00 PM   Modules accepted: Orders

## 2017-08-20 ENCOUNTER — Encounter: Payer: Self-pay | Admitting: Physician Assistant

## 2017-12-08 ENCOUNTER — Encounter: Payer: Self-pay | Admitting: Urgent Care

## 2017-12-08 ENCOUNTER — Ambulatory Visit: Payer: BLUE CROSS/BLUE SHIELD | Admitting: Urgent Care

## 2017-12-08 VITALS — BP 99/66 | HR 90 | Temp 98.2°F | Resp 16 | Ht 62.6 in | Wt 94.2 lb

## 2017-12-08 DIAGNOSIS — R238 Other skin changes: Secondary | ICD-10-CM | POA: Diagnosis not present

## 2017-12-08 DIAGNOSIS — R208 Other disturbances of skin sensation: Secondary | ICD-10-CM

## 2017-12-08 DIAGNOSIS — K921 Melena: Secondary | ICD-10-CM

## 2017-12-08 DIAGNOSIS — R209 Unspecified disturbances of skin sensation: Secondary | ICD-10-CM

## 2017-12-08 DIAGNOSIS — R51 Headache: Secondary | ICD-10-CM

## 2017-12-08 DIAGNOSIS — R233 Spontaneous ecchymoses: Secondary | ICD-10-CM

## 2017-12-08 DIAGNOSIS — J3089 Other allergic rhinitis: Secondary | ICD-10-CM

## 2017-12-08 DIAGNOSIS — K5900 Constipation, unspecified: Secondary | ICD-10-CM

## 2017-12-08 DIAGNOSIS — R1111 Vomiting without nausea: Secondary | ICD-10-CM

## 2017-12-08 DIAGNOSIS — R519 Headache, unspecified: Secondary | ICD-10-CM

## 2017-12-08 MED ORDER — CETIRIZINE HCL 10 MG PO TABS: 10.0000 mg | ORAL_TABLET | Freq: Every day | ORAL | 3 refills | Status: DC

## 2017-12-08 MED ORDER — PSEUDOEPHEDRINE HCL ER 120 MG PO TB12: 120.0000 mg | ORAL_TABLET | Freq: Two times a day (BID) | ORAL | 3 refills | Status: DC

## 2017-12-08 MED ORDER — DOCUSATE SODIUM 50 MG PO CAPS: 50.0000 mg | ORAL_CAPSULE | Freq: Two times a day (BID) | ORAL | 5 refills | Status: DC

## 2017-12-08 MED ORDER — FLUTICASONE PROPIONATE 50 MCG/ACT NA SUSP: 2.0000 | Freq: Every day | NASAL | 11 refills | Status: DC

## 2017-12-08 NOTE — Patient Instructions (Addendum)
Hydrate well with at least 2 liters (1 gallon) of water daily. Please use Miralax for moderate to severe constipation. Take this once a day for the next 2-3 days. Please start Colace (docusate) stool softener, twice a day for at least 1 week. If stools become loose, cut down to once a day for another week.   To help reduce constipation and promote bowel health: 1. Drink at least 64 ounces of water each day 2. Eat plenty of fiber (fruits, vegetables, whole grains, legumes) 3. Be physically active or exercise including walking, jogging, swimming, yoga, etc. 4. For active constipation use a stool softener (docusate) or an osmotic laxative (like Miralax) each day, or as needed.         Vim m?i d? ?ng, Ng??i l?n Allergic Rhinitis, Adult Vim m?i d? ?ng l m?t ph?n ?ng d? ?ng ?nh h??ng ??n nim m?c bn trong m?i. Tnh tr?ng ny gy h?t h?i, ch?y n??c m?i ho?c ngh?t m?i v c?m th?y d?ch nh?y ?i xu?ng thnh sau h?ng (ch?y n??c m?i sau). Vim m?i d? ?ng c th? t? nh? ??n n?ng. C hai lo?i vim m?i d? ?ng:  Theo ma. Lo?i ny c?ng ???c g?i l s?t ma c? kh. N ch? x?y ra trong nh?ng ma nh?t ??nh.  Quanh n?m. Lo?i vim m?i d? ?ng ny x?y ra Kegley b?t k? th?i ?i?m no trong n?m.  Nguyn nhn g gy ra? Tnh tr?ng ny x?y ra khi h? th?ng b?o v? c? th? (h? mi?n d?ch) ph?n ?ng v?i m?t s? ch?t v h?i nh?t ??nh ???c g?i l cc ch?t gy d? ?ng nh? th? cc ch?t ny l m?m b?nh.  Vim m?i d? ?ng theo ma do ph?n hoa gy ra, ph?n hoa c th? ??n t? c?, cy v c? d?i. Vim m?i d? ?ng quanh n?m c th? l do:  M?t b?i trong nh.  V?y da v?t nui.  Bo t? n?m m?c.  Cc d?u hi?u ho?c tri?u ch?ng l g? Nh?ng tri?u ch?ng c?a tnh tr?ng ny bao g?m:  H?t h?i.  Ch?y n??c m?i ho?c ng?t m?i (ngh?t m?i).  S? mu?i pha sau.  Ng?a m?i.  Ch?y n??c m?t.  Kh ng?Marland Kitchen  Bu?n ng? [place] ban ngy.  Ch?n ?on tnh tr?ng ny nh? th? no? Tnh tr?ng ny c th? ???c ch?n ?on d?a [place]:  B?nh s? c?a qu v?.  Khm  th?c th?.  Xt nghi?m ?? ki?m tra cc tnh tr?ng lin quan, ch?ng h?n nh?: ? Hen suy?n. ? B?nh ?au m?t ??. ? Nhi?m trng tai. ? Nhi?m trng ???ng h h?p trn.  Xt nghi?m ?? tm ra ch?t gy d? ?ng no gy ra cc tri?u ch?ng c?a qu v?. Cc xt nghi?m ny c th? bao g?m xt nghi?m mu ho?c xt nghi?m trn da.  Tnh tr?ng ny ???c ?i?u tr? nh? th? no? Khng c cch ?i?u tr? kh?i tnh tr?ng ny, nh?ng vi?c ?i?u tr? c th? gip ki?m sot cc tri?u ch?ng. ?i?u tr? c th? bao g?m:  Dng thu?c ?? ng?n ch?n cc tri?u ch?ng d? ?ng, ch?ng h?n nh? thu?c khng histamine. C th? dng thu?c d??i d?ng thu?c tim, thu?c x?t m?i, ho?c thu?c d?ng vin u?ng.  Hessie Diener ch?t gy d? ?ng.  Kh? nh?y. Ph??ng php ?i?u tr? ny lin quan ??n vi?c tim lin t?c cho ??n khi c? th? c?a qu v? tr? ln t nh?y c?m h?n v?i ch?t gy d? ?ng. C th? th?c hi?n ph??ng php ?i?u tr?  ny n?u cc ph??ng php ?i?u tr? khc khng c tc d?ng.  N?u dng thu?c v trnh ch?t gy d? ?ng khng c tc d?ng, c th? cc lo?i thu?c m?i, m?nh h?n s? ???c k ??n.  Tun th? nh?ng h??ng d?n ny ? nh:  Tm hi?u xem qu v? b? d? ?ng v?i ci g. Cc ch?t gy d? ?ng ph? bi?n l khi, b?i v ph?n hoa.  Trnh nh?ng th? qu v? b? d? ?ng. ?y l m?t s? vi?c qu v? c th? lm ?? gip trnh cc ch?t gy d? ?ng: ? Thay th? th?m b?ng sn g?, ? lt ho?c nh?a vinyl. Th?m c th? dnh v?y da v b?i. ? Khng ht thu?c. Khng cho php ht thu?c trong nh qu v?. ? Thay b? l?c l s??i v ?i?u ho khng kh t nh?t l m?i thng m?t l?n. ? Trong ma d? ?ng:  ?ng c?a s? cng nhi?u cng t?t.  Ln k? ho?ch cho cc ho?t ??ng ngoi tr?i khi l??ng ph?n hoa ? m?c th?p nh?t. Th?i ?i?m ny th??ng l trong cc gi? [place] bu?i t?i.  Khi Bowlby trong nh, hy thay qu?n o v t?m r?a tr??c khi ng?i ln gh? ho?c ga tr?i gi??ng.  Ch? s? d?ng thu?c khng k ??n v thu?c k ??n theo ch? d?n c?a chuyn gia ch?m [place] s?c kh?e.  Tun th? t?t c? cc l?n khm theo di theo ch? d?n  c?a chuyn gia ch?m [place] s?c kh?e. ?i?u ny c vai tr quan tr?ng. Hy lin l?c v?i chuyn gia ch?m [place] s?c kh?e n?u:  Qu v? b? s?t.  Qu v? b? ho dai d?ng.  Qu v? t?o ra ti?ng so khi th? (qu v? th? kh kh).  Cc tri?u ch?ng gy tr? ng?i cho cc sinh ho?t th??ng ngy c?a qu v?. Yu c?u tr? gip ngay l?p t?c n?u:  Qu v? b? kh th?. Tm t?t  Tnh tr?ng ny c th? x? tr ???c b?ng cch dng cc lo?i thu?c theo ch? d?n v trnh cc ch?t gy d? ?ng.  Hy lin l?c v?i chuyn gia ch?m [place] s?c kh?e c?a qu v? n?u qu v? b? ho ho?c s?t dai d?ng.  Trong ma d? ?ng, hy ?ng c?a s? cng nhi?u cng t?t. Thng tin ny khng nh?m m?c ?ch thay th? cho l?i khuyn m chuyn gia ch?m [place] s?c kh?e ni v?i qu v?. Hy b?o ??m qu v? ph?i th?o lu?n b?t k? v?n ?? g m qu v? c v?i chuyn gia ch?m [place] s?c kh?e c?a qu v?. Document Released: 08/21/2015 Document Revised: 08/12/2016 Document Reviewed: 08/12/2016 Elsevier Interactive Patient Education  2018 [employer].     IF you received an x-ray today, you will receive an invoice from [facility] Radiology. Please contact [facility] Radiology at [phone] with questions or concerns regarding your invoice.   IF you received labwork today, you will receive an invoice from [place]. Please contact [facility] at [phone] with questions or concerns regarding your invoice.   Our billing staff will not be able to assist you with questions regarding bills from these companies.  You will be contacted with the lab results as soon as they are available. The fastest way to get your results is to activate your My Chart account. Instructions are located on the last page of this paperwork. If you have not heard from [place] regarding the results in 2 weeks, please contact this office.

## 2017-12-08 NOTE — Progress Notes (Signed)
MRN: [id number] DOB: Jan 14, 1991  Subjective:   Theresa Garza is a 27 y.o. female presenting for several year history of intermittent cold, purple feet. Also has similar symptoms for her hands. She chews on ice, bruises easily.  She has a history of the same.  Has also had difficulty with allergies, cannot pet her dog without issues. Has stuffy, runny nose, bilateral ear fullness, intermittent cough. Used to take Flonase but stopped this on her own. She is also requesting a referral to GI doctor. She is wanting to go to a doctor in [place], Dr. Edmonia Caprio. States that she has random episode of vomiting, decreased appetite, intermittent slight bloody stools, hard stools.  Does not hydrate very well with water, reports that the taste does not sit well with her.  She also feels very full when she drinks water and adds to her GI issues.  Denies smoking cigarettes. Does not drink alcohol.  Lastly, patient reports that she has had headaches, worse when she goes outside.  Feels like she cannot tolerate the sun.  She is worried that she has migraine headaches and would like medication for this.  Denies rashes, dizziness, sinus pain, throat pain, chest pain, shortness of breath, belly pain, dysuria, hematuria, rashes, weakness.  Theresa Garza is not currently taking any medications. Also has No Known Allergies.  Theresa Garza  has a past medical history of Allergy. Denies past surgical history. Denies family history of cancer, diabetes, HTN, HL, heart disease, stroke, mental illness.  However, she does admit that her mother also had GI issues but cannot recall anything specific.  Immunizations are up-to-date per patient, last influenza was 12/11/2016.  Objective:   Vitals: BP 99/66   Pulse 90   Temp 98.2 F (36.8 C) (Oral)   Resp 16   Ht 5' 2.6" (1.59 m)   Wt 94 lb 3.2 oz (42.7 kg)   SpO2 96%   BMI 16.90 kg/m   Wt Readings from Last 3 Encounters:  12/08/17 94 lb 3.2 oz (42.7 kg)  06/23/17 96 lb 3.2 oz (43.6 kg)    05/31/17 95 lb (43.1 kg)    Physical Exam  Constitutional: She is oriented to person, place, and time. She appears well-developed and well-nourished.  HENT:  Mouth/Throat: Oropharynx is clear and moist.  Eyes: Pupils are equal, round, and reactive to light. EOM are normal. Right eye exhibits no discharge. Left eye exhibits no discharge. No scleral icterus.  Neck: Normal range of motion. Neck supple.  Cardiovascular: Normal rate, regular rhythm, normal heart sounds and intact distal pulses. Exam reveals no gallop and no friction rub.  No murmur heard. Pulmonary/Chest: Effort normal and breath sounds normal. No stridor. No respiratory distress. She has no wheezes. She has no rales.  Abdominal: Soft. Bowel sounds are normal. She exhibits no distension and no mass. There is no tenderness. There is no rebound and no guarding.  Musculoskeletal: She exhibits no edema.  Lymphadenopathy:    She has no cervical adenopathy.  Neurological: She is alert and oriented to person, place, and time. She displays normal reflexes. No cranial nerve deficit. Coordination normal.  Skin: Skin is warm and dry. Capillary refill takes less than 2 seconds.  Psychiatric: She has a normal mood and affect.   Assessment and Plan :   Complaining of cold hands - Plan: T3, free, T4, free, CANCELED: Thyroid Panel With TSH, CANCELED: CBC  Cold feet - Plan: CBC with Differential/Platelet, TSH, Basic metabolic panel  Easy bruising  Non-seasonal allergic  rhinitis due to other allergic trigger  Non-intractable vomiting without nausea, unspecified vomiting type - Plan: Ambulatory referral to Gastroenterology  Bloody stools  Constipation, unspecified constipation type  Generalized headaches  Labs pending, recommended patient start hydrating much better to help with headaches and constipation.  I counseled that I believe her vomiting and abdominal fullness is related to both her lack of hydration and constipation.  She is  in agreement to make dietary modifications, referral to Dr. Cyndie ChimeNguyen pending.  Counseled on use of MiraLAX and docusate.  I will also have patient restart Flonase, Zyrtec and add pseudoephedrine.  Counseled that her headaches may also be related to her allergies.  Regarding her feet and hands sensations, suspect that she may be anemic.  Labs pending.  We will follow-up in 2 weeks or sooner as discussed in clinic. A total of 45 minutes were spent face-to-face with the patient during this encounter and over half of that time was spent on discussing treatment options, potential for side effects with medications, counseling and coordination of care.   Wallis BambergMario Hamed Debella, PA-C Primary Care at [facility] [phone] 12/08/2017  1:52 PM

## 2017-12-09 LAB — CBC WITH DIFFERENTIAL/PLATELET
BASOS ABS: 0.1 10*3/uL (ref 0.0–0.2)
Basos: 1 %
EOS (ABSOLUTE): 0.3 10*3/uL (ref 0.0–0.4)
Eos: 3 %
Hematocrit: 44.3 % (ref 34.0–46.6)
Hemoglobin: 14.7 g/dL (ref 11.1–15.9)
IMMATURE GRANS (ABS): 0 10*3/uL (ref 0.0–0.1)
Immature Granulocytes: 0 %
LYMPHS: 29 %
Lymphocytes Absolute: 2.8 10*3/uL (ref 0.7–3.1)
MCH: 30.2 pg (ref 26.6–33.0)
MCHC: 33.2 g/dL (ref 31.5–35.7)
MCV: 91 fL (ref 79–97)
Monocytes Absolute: 0.3 10*3/uL (ref 0.1–0.9)
Monocytes: 3 %
NEUTROS ABS: 6.2 10*3/uL (ref 1.4–7.0)
NEUTROS PCT: 64 %
Platelets: 364 10*3/uL (ref 150–450)
RBC: 4.87 x10E6/uL (ref 3.77–5.28)
RDW: 13.1 % (ref 12.3–15.4)
WBC: 9.8 10*3/uL (ref 3.4–10.8)

## 2017-12-09 LAB — BASIC METABOLIC PANEL
BUN / CREAT RATIO: 14 (ref 9–23)
BUN: 8 mg/dL (ref 6–20)
CALCIUM: 9.7 mg/dL (ref 8.7–10.2)
CHLORIDE: 99 mmol/L (ref 96–106)
CO2: 23 mmol/L (ref 20–29)
Creatinine, Ser: 0.58 mg/dL (ref 0.57–1.00)
GFR calc non Af Amer: 129 mL/min/{1.73_m2} (ref >59)
GFR, EST AFRICAN AMERICAN: 148 mL/min/{1.73_m2} (ref >59)
Glucose: 94 mg/dL (ref 65–99)
POTASSIUM: 4.6 mmol/L (ref 3.5–5.2)
SODIUM: 137 mmol/L (ref 134–144)

## 2017-12-09 LAB — TSH: TSH: 4.83 u[IU]/mL — AB (ref 0.450–4.500)

## 2017-12-09 LAB — T3, FREE: T3, Free: 2.7 pg/mL (ref 2.0–4.4)

## 2017-12-09 LAB — T4, FREE: Free T4: 1.09 ng/dL (ref 0.82–1.77)

## 2017-12-22 ENCOUNTER — Encounter: Payer: Self-pay | Admitting: Urgent Care

## 2017-12-22 ENCOUNTER — Ambulatory Visit: Payer: BLUE CROSS/BLUE SHIELD | Admitting: Urgent Care

## 2017-12-22 VITALS — BP 96/63 | HR 88 | Temp 98.1°F | Resp 16 | Ht 62.0 in | Wt 92.6 lb

## 2017-12-22 DIAGNOSIS — J309 Allergic rhinitis, unspecified: Secondary | ICD-10-CM

## 2017-12-22 DIAGNOSIS — R51 Headache: Secondary | ICD-10-CM | POA: Diagnosis not present

## 2017-12-22 DIAGNOSIS — K5900 Constipation, unspecified: Secondary | ICD-10-CM

## 2017-12-22 DIAGNOSIS — R519 Headache, unspecified: Secondary | ICD-10-CM

## 2017-12-22 MED ORDER — DOCUSATE SODIUM 100 MG PO CAPS: 100.0000 mg | ORAL_CAPSULE | Freq: Two times a day (BID) | ORAL | 2 refills | Status: DC

## 2017-12-22 MED ORDER — METHYLPREDNISOLONE ACETATE 80 MG/ML IJ SUSP
40.0000 mg | Freq: Once | INTRAMUSCULAR | Status: AC
  Administered 2017-12-22: 40 mg via INTRAMUSCULAR

## 2017-12-22 MED ORDER — MONTELUKAST SODIUM 10 MG PO TABS: 10.0000 mg | ORAL_TABLET | Freq: Every day | ORAL | 1 refills | Status: DC

## 2017-12-22 NOTE — Patient Instructions (Addendum)
For constipation, you can increase docusate to 155m twice per day. Do not stop this medication all at once. If you start to have too many bowel movements (3 or more times per day), then decrease back down to 572mtwice per day.    To bn, Ng??i l?n Constipation, Adult To bn l khi m?t ng??i ?i ??i ti?n trong m?t tu?n t h?n so v?i bnh th??ng, ??i ti?n kh kh?n, ho?c ??i ti?n ra phn kh, c?ng, ho?c to h?n bnh th??ng. To bn c th? do m?t tnh tr?ng bn trong gy ra. N c th? tr? ln tr?m tr?ng h?n theo ?? tu?i n?u m?t ng??i dng cc lo?i thu?c nh?t ??nh v khng u?ng ?? n??c. Tun th? nh?ng h??ng d?n ny ? nh: ?n v u?ng   ?n th?c ?n c nhi?u ch?t x? nh? tri cy t??i v rau, ng? c?c nguyn h?t v cc lo?i ??u.  H?n ch? th?c ?n c nhi?u ch?t bo, t ch?t x?, ho?c ch? bi?n s?n qu m?c, ch?ng h?n khoai ty chin, bnh hamburger, bnh quy, k?o v soda.  U?ng ?? n??c ?? gi? cho n??c ti?u trong ho?c c mu vng nh?t. H??ng d?n chung  T?p th? d?c th??ng xuyn theo ch? d?n c?a chuyn gia ch?m [place] s?c kh?e.  ?i v? sinh ngay khi qu v? c nhu c?u. Khng nh?n ?i ??i ti?n.  Ch? s? d?ng thu?c khng k ??n v thu?c k ??n theo ch? d?n c?a chuyn gia ch?m [place] s?c kh?e. Cc thu?c ny bao g?m b?t k? th?c ph?m ch?c n?ng c ch?t x? no.  Th?c hnh cc bi t?p luy?n l?i c? ?y ch?u, ch?ng h?n nh? th? su trong lc th? gin b?ng d??i v th? gin ph?n ?y ch?u trong khi ??i ti?n.  Theo di tnh tr?ng c?a qu v? ?? pht hi?n b?t k? thay ??i no.  Tun th? t?t c? cc l?n khm theo di theo ch? d?n c?a chuyn gia ch?m [place] s?c kh?e. ?i?u ny c vai tr quan tr?ng. Hy lin l?c v?i chuyn gia ch?m [place] s?c kh?e n?u:  Qu v? b? ?au tr?m tr?ng h?n.  Qu v? b? s?t.  Qu v? khng ?i ??i ti?n sau 4 ngy.  Qu v? nn.  Qu v? khng ?i.  Qu v? b? s?t cn.  Qu v? b? ch?y mu ? h?u mn.  Phn c?a qu v? m?ng, trng nh? bt ch. Yu c?u tr? gip ngay l?p t?c n?u:  Qu v? b? s?t v cc tri?u  ch?ng c?a qu v? ??t nhin tr?m tr?ng h?n.  Qu v? sn phn ho?c c mu trong phn.  B?ng c?a qu v? b? ch??ng ln.  Qu v? b? ?au r?t nhi?u ? b?ng.  Qu v? c?m th?y chng m?t ho?c ng?t x?u. Thng tin ny khng nh?m m?c ?ch thay th? cho l?i khuyn m chuyn gia ch?m [place] s?c kh?e ni v?i qu v?. Hy b?o ??m qu v? ph?i th?o lu?n b?t k? v?n ?? g m qu v? c v?i chuyn gia ch?m [place] s?c kh?e c?a qu v?. Document Released: 08/14/2010 Document Revised: 08/12/2016 Document Reviewed: 10/18/2015 Elsevier Interactive Patient Education  2018 [employer]    ?au ??u thng th???ng khng ro? nguyn nhn General Headache Without Cause ?au ??u l ?au hay kh ch?u ?? xung quHolli Humbles?u ho?c khu v??c c?. Nguyn nhn c? th? c?a ?au ??u c th? khng ???c pha?t hi?n. C nhi?u nguyn nhn v lo?i ?au ??u. M?t  vi loa?i ?au ??u ph? bi?n la?:  ?au ??u c?ng th?ng.  ?au n?a ??u.  ?au ??u t??ng c?n.  ?au ??u hng ngy m?n tnh.  Tun th? nh?ng h??ng d?n ny ? nh:  Theo di tnh tr?ng c?a qu v? ?? pht hi?n b?t k? thay ??i no. Ti?n hnh nh?ng b???c sau ?? gip ca?i thi?n ti?nh tra?ng cu?a quy? vi?: X? tr ?au   Ch? s? d?ng thu?c khng k ??n v thu?c k ??n theo ch? d?n c?a chuyn gia ch?m [place] s?c kh?e.  N?m trong m?t phng t?i, yn t?nh khi qu v? b? ?au ??u.  N?u ???c ch? d?n, ch??m ? l?nh [place] vng ??u v c?: ? Cho ? l?nh [place] ti ni lng. ? ?? kh?n t?m ? gi?a da v ti ch??m. ? Ch??m ? l?nh trong 20 pht, 2-3 l?n m?i ngy.  S? d?ng ??m no?ng ho?c t?m n??c nng ?? t?ng nhi?t [place] vng ??u v c? theo ch? d?n c?a chuyn gia ch?m [place] s?c kh?e.  Gi? cho nh sng d?u nh? n?u nh sng m?nh lm qu v? kh ch?u v lm ch?ng ?au ??u t?i t? h?n. ?n v u?ng  ?n u?ng theo l?ch bnh th??ng.  H?n ch? u?ng r??u.  Gi?m l??ng cafein quy? vi? u?ng, ho?c ng?ng u?ng caffeine. H??ng d?n chung  Tun th? t?t c? cc l?n khm theo di theo ch? d?n c?a chuyn gia ch?m [place] s?c kh?e. ?i?u ny c vai  tr quan tr?ng.  Ghi nh?t k ?au ??u hng ngy ?? gip tm ra ?i?u g c th? gy cc c?n ?au ??u. V d?, hy ghi l?i: ? Qu v? ?n v u?ng g. ? Qu v? ? ng? bao lu. ? B?t k? thay ??i no trong ch? ?? ?n ho?c thu?c men.  Th? k? thu?t xoa bp ho?c cc k? thu?t th? gin khc.  H?n ch? c?ng th?ng.  Ng?i th?ng l?ng v khng lm c?ng cc c?.  Khng s? d?ng cc s?n ph?m thu?c l no, bao g?m thu?c l d?ng ht, thu?c l d?ng nhai ho?c thu?c l ?i?n t?. N?u qu v? c?n gip ?? ?? cai thu?c, hy h?i chuyn gia ch?m [place] s?c kh?e.  T?p th? d?c th??ng xuyn theo ch? d?n c?a chuyn gia ch?m [place] s?c kh?e.  Ng? theo m?t l?ch trnh bi?nh th??ng. Ng? 7-9 ti?ng, ho?c th?i gian ng? theo khuy?n ngh? c?a chuyn gia ch?m [place] s?c kh?e. Hy lin l?c v?i chuyn gia ch?m [place] s?c kh?e n?u:  Tri?u ch?ng c?a qu v? khng c?i thi?n ???c b?ng thu?c.  Quy? vi? b? ?au ??u khc v??i ?au ??u thng th??ng.  Qu v? b? bu?n nn ho?c qu v? nn.  Qu v? b? s?t. Yu c?u tr? gip ngay l?p t?c n?u:  Qu v? ?au ??u n?ng h?n.  Qu v? lin t?c b? nn m?a.  Qu v? b? c?ng c?.  Qu v? khng nhn th?y g.  Qu v? b? kh ni.  Qu v? b? ?au ? m?t ho?c tai.  Qu v? b? y?u c? ho?c m?t ki?m sot c?.  Qu v? m?t th?ng b?ng ho?c ?i l?i kh kh?n.  Qu v? c?m th?y mu?n ng?t ho?c ng?t.  Qu v? b? l l?n. Thng tin ny khng nh?m m?c ?ch thay th? cho l?i khuyn m chuyn gia ch?m [place] s?c kh?e ni v?i qu v?. Hy b?o ??m qu v? ph?i th?o lu?n b?t k? v?n ?? g m qu v? c v?i  chuyn gia ch?m  s?c kh?e c?a qu v?. Document Released: 08/21/2015 Document Revised: 08/12/2016 Document Reviewed: 08/22/2014 Elsevier Interactive Patient Education  2018 [employer].      IF you received an x-ray today, you will receive an invoice from [facility] Radiology. Please contact [facility] Radiology at [phone] with questions or concerns regarding your invoice.   IF you received labwork today, you will receive an invoice  from [place]. Please contact [facility] at [phone] with questions or concerns regarding your invoice.   Our billing staff will not be able to assist you with questions regarding bills from these companies.  You will be contacted with the lab results as soon as they are available. The fastest way to get your results is to activate your My Chart account. Instructions are located on the last page of this paperwork. If you have not heard from [place] regarding the results in 2 weeks, please contact this office.

## 2017-12-22 NOTE — Progress Notes (Signed)
MRN: [id number] DOB: 06/17/1990  Subjective:   Theresa Garza is a 27 y.o. female presenting for follow-up on multiple items.  At her last office visit, we started working on her constipation.  She started docusate and reports that it helped her initially, started to have bowel movements every day for 3 to 4 days.  Thereafter her constipation returned even though she continues to take docusate.  She states that she tries to hydrate well but because of her constipation coming back has a difficult time drinking water due to belly bloating and fullness.  She also has had persistent difficulty with her headaches, chronic sinus congestion and sinus inflammation.  Denies fever, ear drainage, ear pain, sore throat.  States that she wakes up every morning needing to clear her throat for mucus.  We did start her on Flonase and Zyrtec at her last office visit on 12/08/2017.  She also has been using pseudoephedrine as needed.  States that she has previously had this issue of chronic allergic rhinitis and has been given antibiotics without much change in her symptoms.  Reports that she has had a very difficult time with the antibiotics will need to prescribe them to her and she would prefer to hold off on this today.  She has not gotten a call about a referral to GI office, referral was placed at her last office visit on 12/08/2017.  She is also requesting a referral to headache clinic. She  reports that she has never smoked. She has never used smokeless tobacco. She reports that she does not drink alcohol.   Theresa Garza has a current medication list which includes the following prescription(s): cetirizine, docusate sodium, and fluticasone. Also has No Known Allergies.  Theresa Garza  has a past medical history of Allergy.   Objective:   Vitals: BP 96/63   Pulse 88   Temp 98.1 F (36.7 C) (Oral)   Resp 16   Ht 5\' 2"  (1.575 m)   Wt 92 lb 9.6 oz (42 kg)   SpO2 95%   BMI 16.94 kg/m   Physical Exam  Constitutional: She is  oriented to person, place, and time. She appears well-developed and well-nourished.  HENT:  TMs opaque bilaterally.  There is mucosal edema, boggy nasal turbinates.  Throat with thick streaks of postnasal drainage.  Eyes: Right eye exhibits no discharge. Left eye exhibits no discharge. No scleral icterus.  Neck: Normal range of motion. Neck supple. No thyromegaly present.  Cardiovascular: Normal rate.  Pulmonary/Chest: Effort normal.  Lymphadenopathy:    She has no cervical adenopathy.  Neurological: She is alert and oriented to person, place, and time. She displays normal reflexes. No cranial nerve deficit. Coordination normal.  Skin: Skin is warm and dry.  Psychiatric: She has a normal mood and affect.   Assessment and Plan :   Constipation, unspecified constipation type  Generalized headaches - Plan: AMB referral to headache clinic  Chronic allergic rhinitis - Plan: methylPREDNISolone acetate (DEPO-MEDROL) injection 40 mg  Sinus headache - Plan: methylPREDNISolone acetate (DEPO-MEDROL) injection 40 mg  Will send a note to our referral staff to check on the status of referral to GI.  We will continue to try to manage her constipation with docusate as she prefers not to switch to Linzess.  Increase to 100 mg twice daily for docusate.  For her chronic allergic rhinitis, we will use IM Depo-Medrol today, add Singulair to her current regimen of Flonase, Zyrtec and pseudoephedrine as needed.  Referral placed to headache  clinic.  Patient is to follow-up in 4 weeks or sooner if symptoms worsen significantly.  Wallis BambergMario Hezekiah Veltre, PA-C [facility] Care at [facility] [phone] 12/22/2017  2:19 PM

## 2017-12-26 ENCOUNTER — Telehealth: Payer: Self-pay | Admitting: Urgent Care

## 2017-12-26 NOTE — Telephone Encounter (Signed)
Resent to [facility]  office notes/bcbs and referral 8/16

## 2018-02-04 ENCOUNTER — Ambulatory Visit: Payer: BLUE CROSS/BLUE SHIELD | Admitting: Urgent Care

## 2018-02-04 ENCOUNTER — Other Ambulatory Visit: Payer: Self-pay

## 2018-02-04 ENCOUNTER — Encounter: Payer: Self-pay | Admitting: Urgent Care

## 2018-02-04 VITALS — BP 102/67 | HR 78 | Temp 97.9°F | Resp 16 | Ht 62.0 in | Wt 89.0 lb

## 2018-02-04 DIAGNOSIS — R21 Rash and other nonspecific skin eruption: Secondary | ICD-10-CM | POA: Diagnosis not present

## 2018-02-04 DIAGNOSIS — Z23 Encounter for immunization: Secondary | ICD-10-CM

## 2018-02-04 DIAGNOSIS — R51 Headache: Secondary | ICD-10-CM | POA: Diagnosis not present

## 2018-02-04 DIAGNOSIS — R519 Headache, unspecified: Secondary | ICD-10-CM

## 2018-02-04 DIAGNOSIS — R7989 Other specified abnormal findings of blood chemistry: Secondary | ICD-10-CM

## 2018-02-04 DIAGNOSIS — L299 Pruritus, unspecified: Secondary | ICD-10-CM

## 2018-02-04 MED ORDER — TRIAMCINOLONE ACETONIDE 0.1 % EX CREA: 1.0000 "application " | TOPICAL_CREAM | Freq: Two times a day (BID) | CUTANEOUS | 0 refills | Status: DC

## 2018-02-04 MED ORDER — PSEUDOEPHEDRINE HCL ER 120 MG PO TB12: 120.0000 mg | ORAL_TABLET | Freq: Two times a day (BID) | ORAL | 5 refills | Status: DC | PRN

## 2018-02-04 NOTE — Patient Instructions (Addendum)
Thyroid-Stimulating Hormone Test Why am I having this test? A thyroid-stimulating hormone (TSH) test is a blood test that is done to measure the level of TSH, also known as thyrotropin, in your blood. TSH is produced by the pituitary gland. The pituitary gland is a small organ located just below the brain, behind your eyes and nasal passages. It is part of a system that monitors and maintains thyroid hormone levels and thyroid gland function. Thyroid hormones affect many body parts and systems, including the system that affects how quickly your body burns fuel for energy. Your health care provider may recommend testing your TSH level if you have signs and symptoms of abnormal thyroid hormone levels. Knowing the level of TSH in your blood can help your health care provider:  Diagnose a thyroid gland or pituitary gland disorder.  Manage your condition and treatment if you have hypothyroidism or hyperthyroidism.  What kind of sample is taken? A blood sample is required for this test. It is usually collected by inserting a needle into a vein. How do I prepare for this test? There is no preparation required for this test. What are the reference ranges? Reference rangesare considered healthy rangesestablished after testing a large group of healthy people. Reference rangesmay vary among different people, labs, and hospitals. It is your responsibility to obtain your test results. Ask the lab or department performing the test when and how you will get your results. Range of Normal Values:  Adult: 0.3-5 microunits/mL or 0.3-5 milliunits/L (SI units).  Newborn: 22-18 microunits/mL or 3-18 milliunits/L.  Cord: 3-12 microunits/mL or 3-12 milliunits/L.  What do the results mean? A high level of TSH may mean:  Your thyroid gland is not making enough thyroid hormones. When the thyroid gland does not make enough thyroid hormones, the pituitary gland releases TSH into the bloodstream. The  higher-than-normal levels of TSH prompt the thyroid gland to release more thyroid hormones.  You are getting an insufficient level of thyroid hormone medicine, if you are receiving this type of treatment.  There is a problem with the pituitary gland (rare).  A low level of TSH can indicate a problem with the pituitary gland. Talk with your health care provider to discuss your results, treatment options, and if necessary, the need for more tests. Talk with your health care provider if you have any questions about your results. Talk with your health care provider to discuss your results, treatment options, and if necessary, the need for more tests. Talk with your health care provider if you have any questions about your results. This information is not intended to replace advice given to you by your health care provider. Make sure you discuss any questions you have with your health care provider. Document Released: 05/24/2004 Document Revised: 12/31/2015 Document Reviewed: 09/22/2013 Elsevier Interactive Patient Education  [employer].     If you have lab work done today you will be contacted with your lab results within the next 2 weeks.  If you have not heard from [place] then please contact us. The fastest way to get your results is to register for My Chart.   IF you received an x-ray today, you will receive an invoice from [facility]. Please contact [facility] at [phone] with questions or concerns regarding your invoice.   IF you received labwork today, you will receive an invoice from [facility]. Please contact [facility] at [phone] with questions or concerns regarding your invoice.   Our billing staff will not be able to  assist you with questions regarding bills from these companies.  You will be contacted with the lab results as soon as they are available. The fastest way to get your results is to activate your My Chart account. Instructions are located on  the last page of this paperwork. If you have not heard from [place] regarding the results in 2 weeks, please contact this office.

## 2018-02-04 NOTE — Progress Notes (Signed)
    MRN: [id number] DOB: [birth year]  Subjective:   Theresa Garza is a 27 y.o. female presenting for general follow-up.  Patient reports that she is gone to the headache clinic and is starting to get treatments there with good relief.  She states that she was advised to stop Zyrtec and Flonase.  She continues to use pseudoephedrine daily and is requesting a refill.  She has having bilateral ear itching and discomfort and would like a topical therapy for this.  She also has itching over her posterior neck.  Denies fever, sinus pain, sinus congestion, ear drainage, throat pain, cough, shortness of breath, nausea, vomiting, belly pain.  She does admit that her GI symptoms have also improved from getting management of her headaches.  reports that she has never smoked. She has never used smokeless tobacco. She reports that she does not drink alcohol.   Theresa Garza has a current medication list which includes the following prescription(s): docusate sodium, montelukast, sodium chloride, zonisamide, cetirizine, and fluticasone. Also has No Known Allergies.  Theresa Garza  has a past medical history of Allergy. Denies past surgical history.  Objective:   Vitals: BP 102/67 (BP Location: Right Arm, Patient Position: Sitting, Cuff Size: Small)   Pulse 78   Temp 97.9 F (36.6 C) (Oral)   Resp 16   Ht 5\' 2"  (1.575 m)   Wt 89 lb (40.4 kg)   LMP 01/31/2018   SpO2 98%   BMI 16.28 kg/m   Physical Exam  Constitutional: She is oriented to person, place, and time. She appears well-developed and well-nourished.  HENT:  Right Ear: Tympanic membrane normal. No drainage, swelling or tenderness. Tympanic membrane is not perforated, not erythematous, not retracted and not bulging.  Left Ear: Tympanic membrane normal. No drainage, swelling or tenderness. Tympanic membrane is not perforated, not erythematous, not retracted and not bulging.  Ears:  Mouth/Throat: Oropharynx is clear and moist.  Eyes: Right eye exhibits no discharge.  Left eye exhibits no discharge. No scleral icterus.  Cardiovascular: Normal rate, regular rhythm, normal heart sounds and intact distal pulses. Exam reveals no gallop and no friction rub.  No murmur heard. Pulmonary/Chest: Effort normal and breath sounds normal. No stridor. No respiratory distress. She has no wheezes. She has no rales.  Neurological: She is alert and oriented to person, place, and time.  Skin:      Assessment and Plan :   Elevated TSH - Plan: TSH  Generalized headaches  Rash and nonspecific skin eruption  Itching  Labs pending, counseled patient that her thyroid hormones were normal from her last check but warrants a recheck of her elevated TSH.  It is possible that her headaches are related to this as TSH is secreted from the pituitary gland.  Patient was agreeable to rechecking a TSH and we will base our decision to obtain imaging if that level is persistently elevated.  I refilled her pseudoephedrine and recommended that she continue follow-up with the headache clinic.  For the rash I did prescribe a topical steroid and educated patient on use of the steroid cream. Counseled patient on potential for adverse effects with medications prescribed today, patient verbalized understanding.   Wallis Bamberg, PA-C [facility] [phone] 02/04/2018 3:11 PM

## 2018-02-05 ENCOUNTER — Encounter: Payer: Self-pay | Admitting: Urgent Care

## 2018-02-05 LAB — TSH: TSH: 5.56 u[IU]/mL — AB (ref 0.450–4.500)

## 2018-03-03 ENCOUNTER — Ambulatory Visit: Payer: BLUE CROSS/BLUE SHIELD | Admitting: Physician Assistant

## 2018-03-03 ENCOUNTER — Other Ambulatory Visit: Payer: Self-pay

## 2018-03-03 ENCOUNTER — Encounter: Payer: Self-pay | Admitting: Physician Assistant

## 2018-03-03 VITALS — BP 104/69 | HR 91 | Temp 98.4°F | Resp 18 | Ht 62.0 in | Wt 87.4 lb

## 2018-03-03 DIAGNOSIS — R05 Cough: Secondary | ICD-10-CM | POA: Diagnosis not present

## 2018-03-03 DIAGNOSIS — R7989 Other specified abnormal findings of blood chemistry: Secondary | ICD-10-CM

## 2018-03-03 DIAGNOSIS — R059 Cough, unspecified: Secondary | ICD-10-CM

## 2018-03-03 DIAGNOSIS — J3089 Other allergic rhinitis: Secondary | ICD-10-CM

## 2018-03-03 MED ORDER — BENZONATATE 100 MG PO CAPS: 100.0000 mg | ORAL_CAPSULE | Freq: Three times a day (TID) | ORAL | 0 refills | Status: DC | PRN

## 2018-03-03 NOTE — Progress Notes (Signed)
MRN: [id number] DOB: Nov 07, 1990  Subjective:   Theresa Garza is a 27 y.o. female presenting for chief complaint of Referral (endocrinology) and Cough ( ) .   F/u on elevated TSH. Had normal free t3 and t4 in 11/2016. Has not gotten it rechecked. She is more on the hot natured side. Used to have really cold hands but that is improving.  Denies chest pain, heart palpitations, fatigue, weight gain, hair thinning, irregular menses, cold intolerance, brittle skin, constipation.  Notes her stools are more loose.  No family history of thyroid disease.  Also been having a dry cough for the past 10 days.  Feels like it is related to her sinuses.  Has tried allergy medication and throat lozenges with no full relief.  Has some nasal congestion which is common for her due to her allergies.  Denies fever, chills, shortness of breath, wheezing, chest pain, hemoptysis, nausea, vomiting.  Denies recent travel.  Has past medical history of seasonal allergies.  Takes daily Singulair and nasal saline rinses.  She is currently being treated by neurology for frequent headaches.  They do not want her to use any Flonase or antihistamines.  Denies smoking   Theresa Garza has a current medication list which includes the following prescription(s): docusate sodium, montelukast, pseudoephedrine, sodium chloride, triamcinolone cream, and zonisamide. Also has No Known Allergies.  Theresa Garza  has a past medical history of Allergy. Also  has no past surgical history on file.    Social History   Socioeconomic History  . Marital status: Married    Spouse name: Not on file  . Number of children: Not on file  . Years of education: Not on file  . Highest education level: Not on file  Occupational History  . Not on file  Social Needs  . Financial resource strain: Not on file  . Food insecurity:    Worry: Not on file    Inability: Not on file  . Transportation needs:    Medical: Not on file    Non-medical: Not on file  Tobacco Use    . Smoking status: Never Smoker  . Smokeless tobacco: Never Used  Substance and Sexual Activity  . Alcohol use: No  . Drug use: Not on file  . Sexual activity: Not on file  Lifestyle  . Physical activity:    Days per week: Not on file    Minutes per session: Not on file  . Stress: Not on file  Relationships  . Social connections:    Talks on phone: Not on file    Gets together: Not on file    Attends religious service: Not on file    Active member of club or organization: Not on file    Attends meetings of clubs or organizations: Not on file    Relationship status: Not on file  . Intimate partner violence:    Fear of current or ex partner: Not on file    Emotionally abused: Not on file    Physically abused: Not on file    Forced sexual activity: Not on file  Other Topics Concern  . Not on file  Social History Narrative  . Not on file    Objective:   Vitals: BP 104/69   Pulse 91   Temp 98.4 F (36.9 C) (Oral)   Resp 18   Ht 5\' 2"  (1.575 m)   Wt 87 lb 6.4 oz (39.6 kg)   LMP 03/02/2018   SpO2 98%   BMI  15.99 kg/m   Physical Exam  Constitutional: She is oriented to person, place, and time. She appears well-developed and well-nourished. No distress.  HENT:  Head: Normocephalic and atraumatic.  Right Ear: Tympanic membrane, external ear and ear canal normal.  Left Ear: Tympanic membrane, external ear and ear canal normal.  Nose: Mucosal edema and rhinorrhea present. Right sinus exhibits no maxillary sinus tenderness and no frontal sinus tenderness. Left sinus exhibits no maxillary sinus tenderness and no frontal sinus tenderness.  Mouth/Throat: Uvula is midline and mucous membranes are normal. No posterior oropharyngeal edema, posterior oropharyngeal erythema or tonsillar abscesses. No tonsillar exudate.  Eyes: Conjunctivae are normal.  Neck: Normal range of motion. No thyroid mass and no thyromegaly present.  Cardiovascular: Normal rate, regular rhythm, normal heart  sounds and intact distal pulses.  Pulmonary/Chest: Effort normal and breath sounds normal. She has no decreased breath sounds. She has no wheezes. She has no rhonchi. She has no rales.  Lymphadenopathy:       Head (right side): No submental, no submandibular, no tonsillar, no preauricular, no posterior auricular and no occipital adenopathy present.       Head (left side): No submental, no submandibular, no tonsillar, no preauricular, no posterior auricular and no occipital adenopathy present.    She has no cervical adenopathy.       Right: No supraclavicular adenopathy present.       Left: No supraclavicular adenopathy present.  Neurological: She is alert and oriented to person, place, and time.  Skin: Skin is warm and dry.  Psychiatric: She has a normal mood and affect.  Vitals reviewed.    Wt Readings from Last 3 Encounters:  03/03/18 87 lb 6.4 oz (39.6 kg)  02/04/18 89 lb (40.4 kg)  12/22/17 92 lb 9.6 oz (42 kg)     No results found for this or any previous visit (from the past 24 hour(s)).  Assessment and Plan :  1. Non-seasonal allergic rhinitis due to other allergic trigger Continue with current medication regimen. Has f/u with neurology today. Suggest discussing with them if she is now a candidate for antihistamines as this would help with allergies.   2. Elevated TSH Labs pending.  - TSH - T3, Free - T4, Free  3. Cough Likely due to PND from allergies. Suggested use of neti pot. No signs of infectious etiology at this time. Vitals stable. Lungs CTAB. Given trial of tessalon if needed. Advised to return to clinic if symptoms worsen, do not improve, or as needed. - benzonatate (TESSALON) 100 MG capsule; Take 1-2 capsules (100-200 mg total) by mouth 3 (three) times daily as needed for cough.  Dispense: 40 capsule; Refill: 0  Benjiman Core, PA-C  [facility] 03/03/2018 10:21 AM

## 2018-03-03 NOTE — Patient Instructions (Addendum)
For cough, try using the neti pot, as it seems to be coming from your sinues. You can get this over the counter at [facility].   You can also try the prescribed Tessalon pearls during the day. Tea recipe for cough: boil water, add 2 inches shaved ginger root, steep 15 minutes, add juice from 2 full lemons, and 2 tbsp honey.   Please let me know if you are not seeing any improvement or get worse in 5-7 days.   If you have lab work done today you will be contacted with your lab results within the next 2 weeks.  If you have not heard from [place] then please contact us. The fastest way to get your results is to register for My Chart.   IF you received an x-ray today, you will receive an invoice from [facility]. Please contact [facility] at [phone] with questions or concerns regarding your invoice.   IF you received labwork today, you will receive an invoice from [facility]. Please contact [facility] at [phone] with questions or concerns regarding your invoice.   Our billing staff will not be able to assist you with questions regarding bills from these companies.  You will be contacted with the lab results as soon as they are available. The fastest way to get your results is to activate your My Chart account. Instructions are located on the last page of this paperwork. If you have not heard from [place] regarding the results in 2 weeks, please contact this office.

## 2018-03-04 LAB — TSH: TSH: 4.88 u[IU]/mL — ABNORMAL HIGH (ref 0.450–4.500)

## 2018-03-04 LAB — T4, FREE: Free T4: 1.23 ng/dL (ref 0.82–1.77)

## 2018-03-04 LAB — T3, FREE: T3, Free: 3 pg/mL (ref 2.0–4.4)

## 2018-04-06 ENCOUNTER — Encounter: Payer: Self-pay | Admitting: Family Medicine

## 2018-04-06 DIAGNOSIS — E038 Other specified hypothyroidism: Secondary | ICD-10-CM | POA: Insufficient documentation

## 2018-04-06 DIAGNOSIS — E039 Hypothyroidism, unspecified: Secondary | ICD-10-CM | POA: Insufficient documentation

## 2018-04-15 ENCOUNTER — Telehealth: Payer: Self-pay | Admitting: Physician Assistant

## 2018-04-15 NOTE — Telephone Encounter (Signed)
MyChart message sent to pt about rescheduling their appt on 08/03/18

## 2018-04-20 ENCOUNTER — Encounter: Payer: Self-pay | Admitting: Physician Assistant

## 2018-06-15 ENCOUNTER — Other Ambulatory Visit: Payer: Self-pay | Admitting: Urgent Care

## 2018-08-03 ENCOUNTER — Ambulatory Visit: Payer: BLUE CROSS/BLUE SHIELD | Admitting: Physician Assistant

## 2018-08-24 IMAGING — DX DG CHEST 2V
2 series · 2 of 2 positions shown · non-contrast
Comparison: None.

CLINICAL DATA: Worsening cough for 3 weeks, subjective fever

EXAM:
CHEST  2 VIEW

[chest pa]
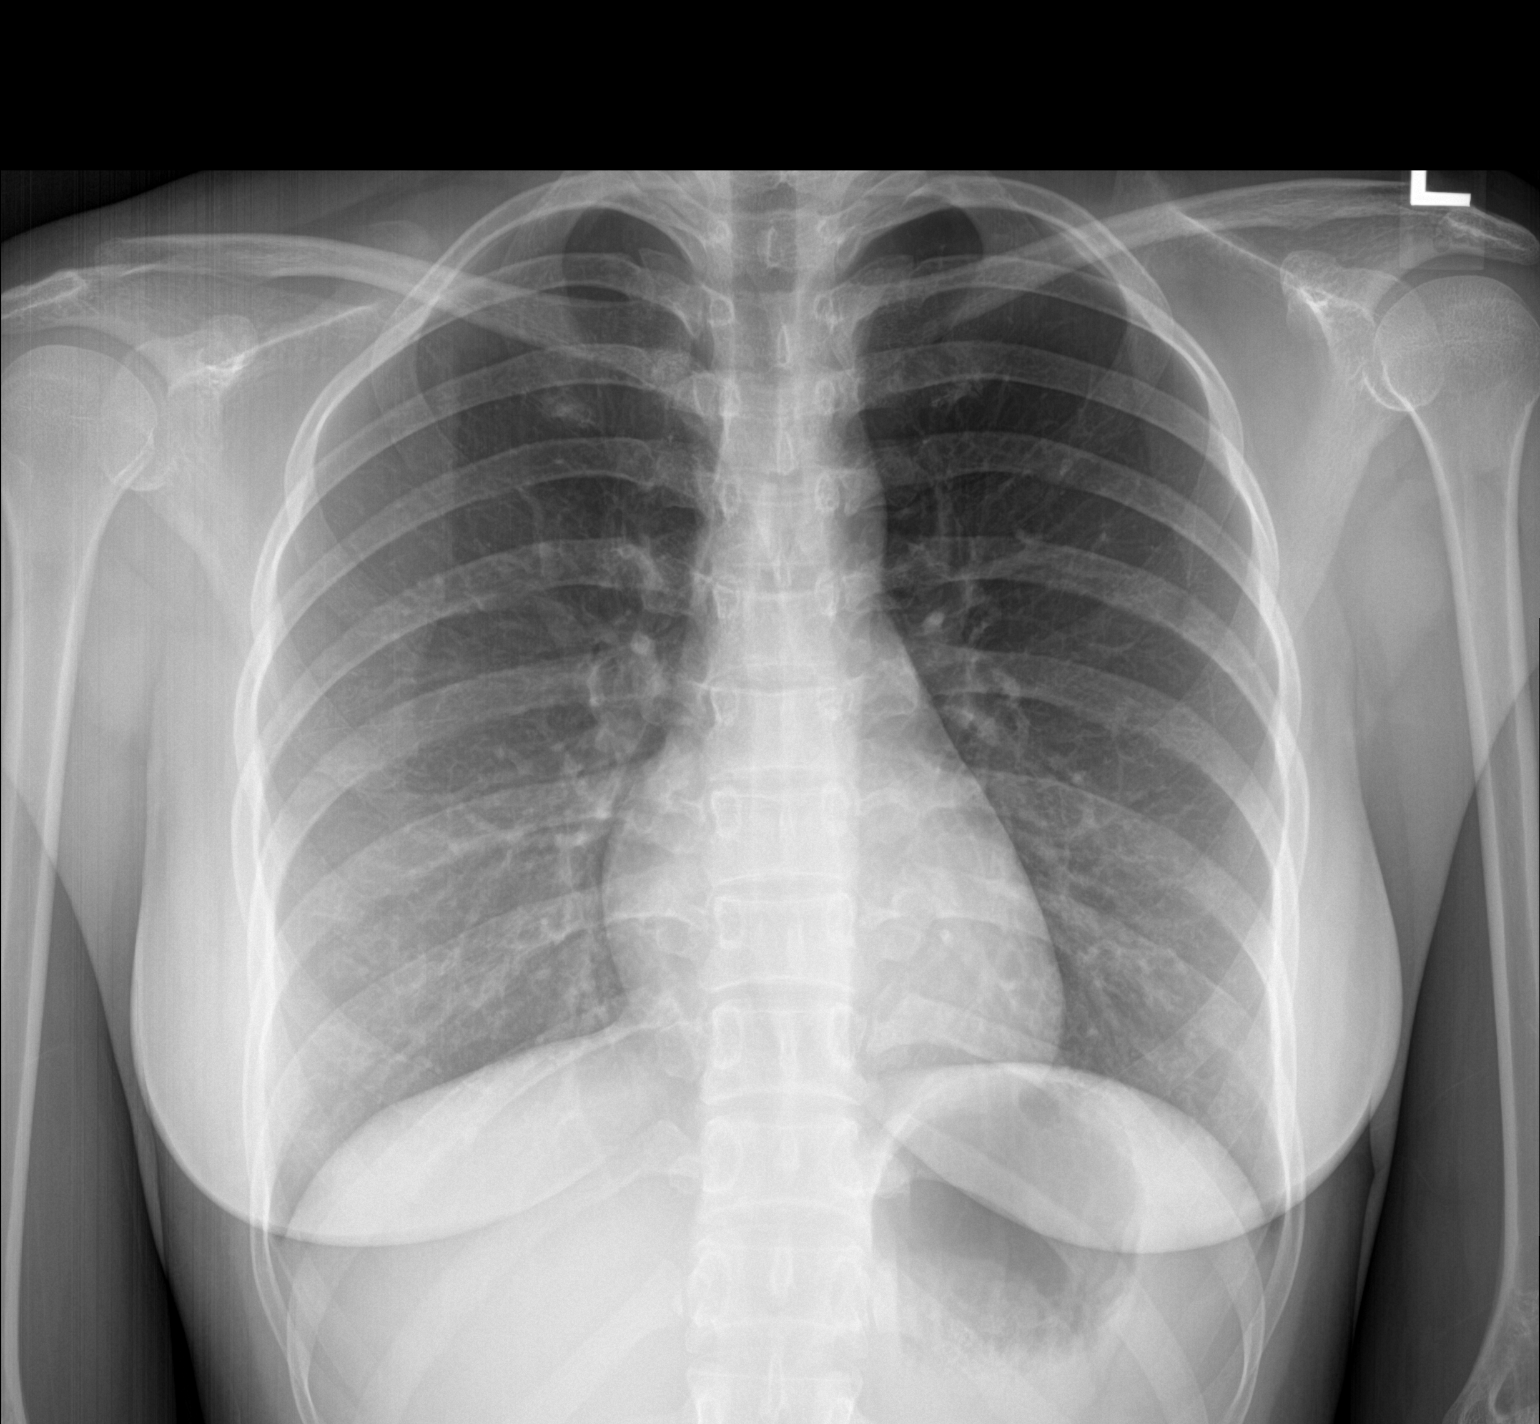

[chest lat]
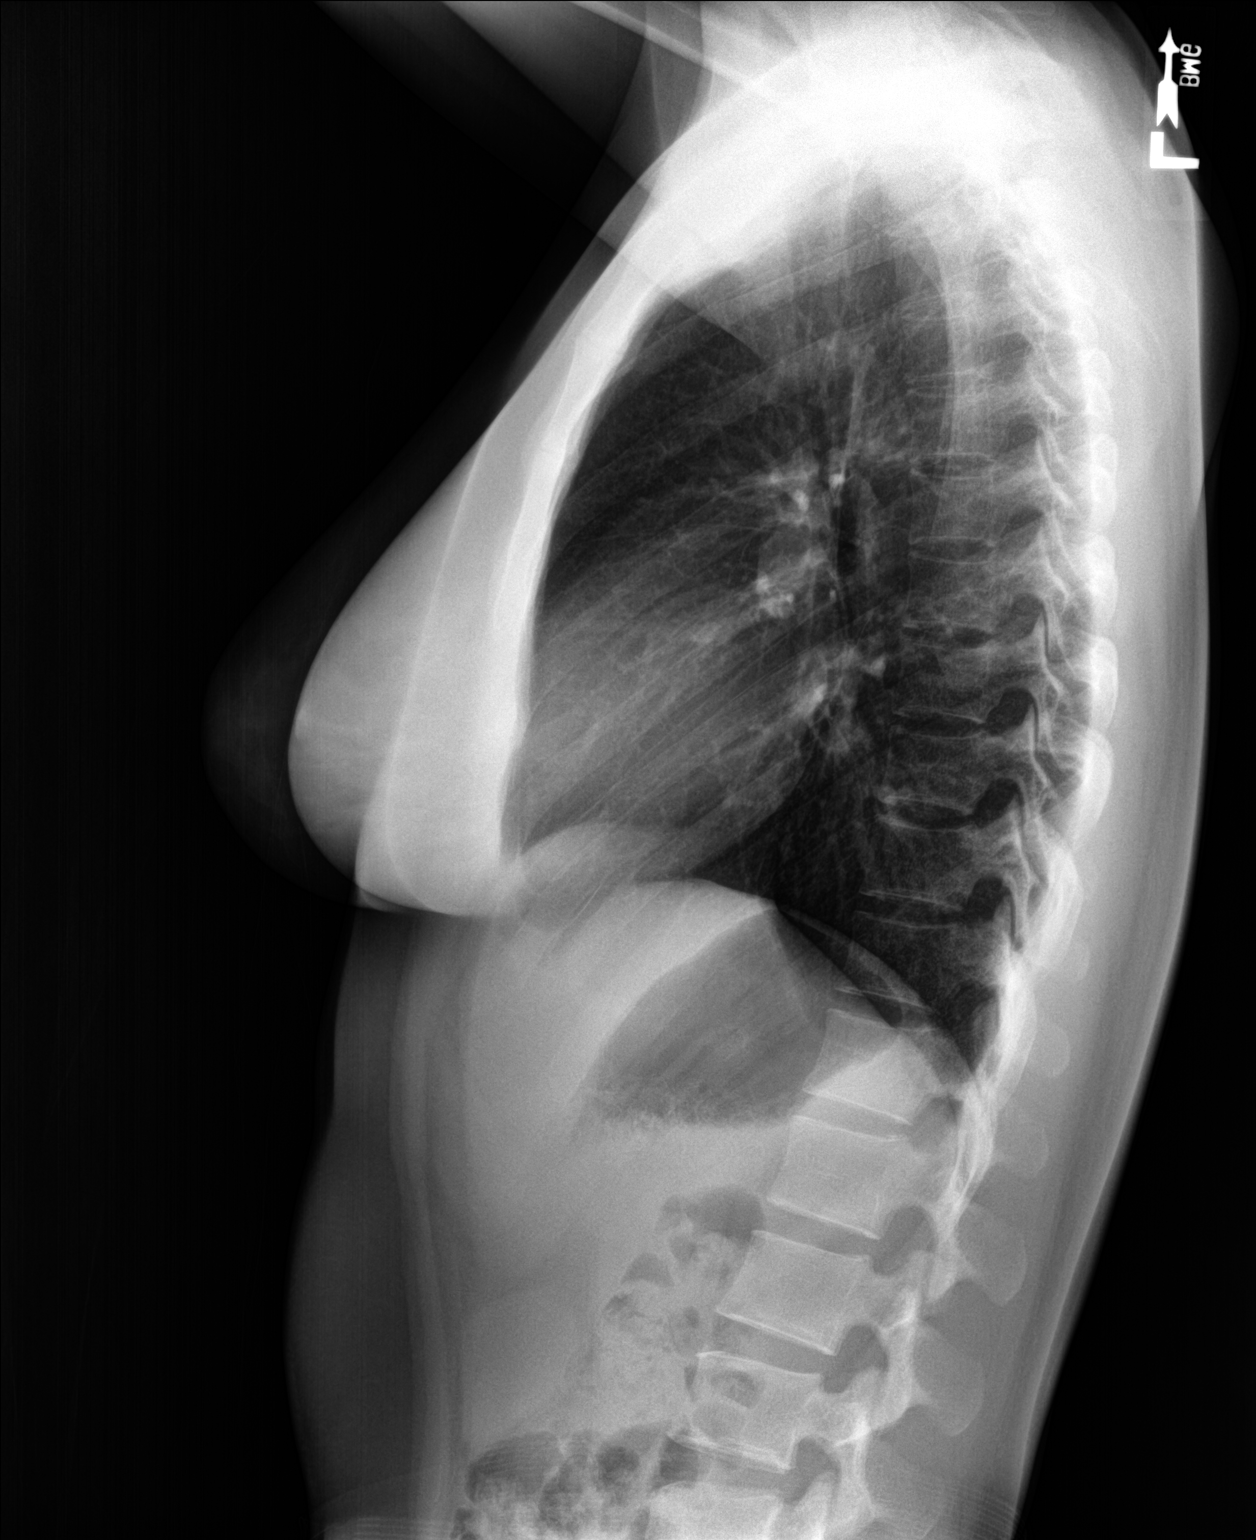

[2 of 2 positions shown; findings below may reference images not displayed]

FINDINGS: The heart size and mediastinal contours are within normal limits.
Both lungs are clear. The visualized skeletal structures are
unremarkable.
IMPRESSION: No active cardiopulmonary disease.

## 2019-06-07 ENCOUNTER — Ambulatory Visit: Payer: No Typology Code available for payment source | Attending: Internal Medicine

## 2019-06-07 DIAGNOSIS — Z20822 Contact with and (suspected) exposure to covid-19: Secondary | ICD-10-CM

## 2019-06-08 LAB — NOVEL CORONAVIRUS, NAA: SARS-CoV-2, NAA: NOT DETECTED

## 2019-06-14 ENCOUNTER — Ambulatory Visit: Payer: No Typology Code available for payment source | Attending: Internal Medicine

## 2019-06-14 DIAGNOSIS — Z20822 Contact with and (suspected) exposure to covid-19: Secondary | ICD-10-CM

## 2019-06-15 LAB — NOVEL CORONAVIRUS, NAA: SARS-CoV-2, NAA: NOT DETECTED

## 2021-10-30 ENCOUNTER — Ambulatory Visit: Payer: 59 | Admitting: Allergy & Immunology

## 2021-10-30 ENCOUNTER — Encounter: Payer: Self-pay | Admitting: Allergy & Immunology

## 2021-10-30 VITALS — BP 106/64 | HR 118 | Temp 98.2°F | Resp 16 | Ht <= 58 in | Wt 96.2 lb

## 2021-10-30 DIAGNOSIS — R519 Headache, unspecified: Secondary | ICD-10-CM

## 2021-10-30 DIAGNOSIS — K9049 Malabsorption due to intolerance, not elsewhere classified: Secondary | ICD-10-CM

## 2021-10-30 DIAGNOSIS — J310 Chronic rhinitis: Secondary | ICD-10-CM | POA: Diagnosis not present

## 2021-10-30 NOTE — Progress Notes (Signed)
NEW PATIENT  Date of Service/Encounter:  10/30/21  Consult requested by: Patient, No Pcp Per   Assessment:   Chronic rhinitis - with negative testing (consider intradermal testing in the future if needed)  Nonintractable headache  Food intolerance  Plan/Recommendations:   1. Chronic rhinitis with headaches - Testing today showed: negative to the entire panel - Copy of test results provided.  - Start taking: Zyrtec (cetirizine) 10mg  tablet once daily and Flonase (fluticasone) one spray per nostril daily (AIM FOR EAR ON EACH SIDE) - If this does not help, I would stop after ONE MONTH.  - You can use an extra dose of the antihistamine, if needed, for breakthrough symptoms.  - Consider nasal saline rinses 1-2 times daily to remove allergens from the nasal cavities as well as help with mucous clearance (this is especially helpful to do before the nasal sprays are given)  2. Food intolerance - Testing was negative to the most common foods. - There is a the low positive predictive value of food allergy testing and hence the high possibility of false positives. - In contrast, food allergy testing has a high negative predictive value, therefore if testing is negative we can be relatively assured that they are indeed negative.  - Copy of testing results provided.   3. Return in about 3 months (around 01/30/2022).     This note in its entirety was forwarded to the Provider who requested this consultation.  Subjective:   Theresa Garza is a 31 y.o. female presenting today for evaluation of  Chief Complaint  Patient presents with   Allergy Testing   Cough   Allergic Rhinitis     Smell sensitivity, nose running sometimes her nose bleeds   Other    Cracked skin on the back of her head and she has tried a lot of creams that have not helped.    Theresa Garza has a history of the following: Patient Active Problem List   Diagnosis Date Noted   Subclinical hypothyroidism 04/06/2018    Esophageal reflux 01/03/2015   Other headache syndrome 01/03/2015   CN (constipation) 01/03/2015    History obtained from: chart review and patient.  Theresa Garza was referred by Patient, No Pcp Per.     Theresa Garza is a 31 y.o. female presenting for an evaluation of headaches and allergies .   She reports that she has some eye and throat itching. She has rhinorrhea and congestion. She is unable to smell. She reports that she is sensitive to smells. Symptoms have been ongoing for a period of years. She has had coughing for a long time.   She has taken a lot of medicines from her doctor. She has had some montelukast without improvement.   She gets injections every two weeks for trigger points for treatment of her migraines; these are not working very well. She does not use medicine routinely.  She has had the migraine and sinus symptoms for a long time. She has used nasal spray in the past.   She had a sinus CT in March 2023 that demonstrated the following:   IMPRESSION: Mild mucosal thickening of both maxillary sinuses without layering fluid. Mucosal thickening of the diminutive right division of the sphenoid sinus.  Food Allergy Symptom History: She has itching in her throat with nuts. Cheese causes some stomach pain, especially with large amounts.  She can tolerate shrimp without a problem.   Otherwise, there is no history of other atopic diseases, including  food allergies, drug allergies, stinging insect allergies, eczema, urticaria, or contact dermatitis. There is no significant infectious history. Vaccinations are up to date.    Past Medical History: Patient Active Problem List   Diagnosis Date Noted   Subclinical hypothyroidism 04/06/2018   Esophageal reflux 01/03/2015   Other headache syndrome 01/03/2015   CN (constipation) 01/03/2015    Medication List:  Allergies as of 10/30/2021   No Known Allergies      Medication List        Accurate as of October 30, 2021 11:59 PM. If  you have any questions, ask your nurse or doctor.          STOP taking these medications    docusate sodium 100 MG capsule Commonly known as: COLACE Stopped by: Alfonse Spruce, MD   montelukast 10 MG tablet Commonly known as: SINGULAIR Stopped by: Alfonse Spruce, MD   pseudoephedrine 120 MG 12 hr tablet Commonly known as: Sudafed 12 Hour Stopped by: Alfonse Spruce, MD   sodium chloride 0.65 % Soln nasal spray Commonly known as: OCEAN Stopped by: Alfonse Spruce, MD   zonisamide 100 MG capsule Commonly known as: ZONEGRAN Stopped by: Alfonse Spruce, MD       TAKE these medications    benzonatate 100 MG capsule Commonly known as: TESSALON Take 1-2 capsules (100-200 mg total) by mouth 3 (three) times daily as needed for cough.   triamcinolone cream 0.1 % Commonly known as: KENALOG Apply 1 application topically 2 (two) times daily.        Birth History: non-contributory  Developmental History: non-contributory  Past Surgical History: History reviewed. No pertinent surgical history.   Family History: History reviewed. No pertinent family history.   Social History: Theresa Garza lives at home with her family.  She lives in a house that is 30 years old.  There is wood in the main living areas and carpeting in the bedrooms.  They have electric heating and central cooling.  There are 3 dogs inside of the home.  There are dust mite covers on the bed as well as the pillows.  There is no tobacco exposure.  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  She does use a HEPA filter in her home.  She is exposed to fumes, chemicals, and dust.    Review of Systems  Constitutional: Negative.  Negative for chills, fever, malaise/fatigue and weight loss.  HENT: Negative.  Negative for congestion, ear discharge and ear pain.   Eyes:  Negative for pain, discharge and redness.  Respiratory:  Negative for cough, sputum production,  shortness of breath and wheezing.   Cardiovascular: Negative.  Negative for chest pain and palpitations.  Gastrointestinal:  Negative for abdominal pain, heartburn, nausea and vomiting.  Skin: Negative.  Negative for itching and rash.  Neurological:  Positive for headaches. Negative for dizziness.  Endo/Heme/Allergies:  Positive for environmental allergies. Does not bruise/bleed easily.       Objective:   Blood pressure 106/64, pulse (!) 118, temperature 98.2 F (36.8 C), temperature source Temporal, resp. rate 16, height 4\' 9"  (1.448 m), weight 96 lb 3.2 oz (43.6 kg), SpO2 99 %. Body mass index is 20.82 kg/m.     Physical Exam Vitals reviewed.  Constitutional:      Appearance: She is well-developed.  HENT:     Head: Normocephalic and atraumatic.     Right Ear: Tympanic membrane, ear canal and external ear normal.  No drainage, swelling or tenderness. Tympanic membrane is not injected, scarred, erythematous, retracted or bulging.     Left Ear: Tympanic membrane, ear canal and external ear normal. No drainage, swelling or tenderness. Tympanic membrane is not injected, scarred, erythematous, retracted or bulging.     Nose: No nasal deformity, septal deviation, mucosal edema or rhinorrhea.     Right Turbinates: Enlarged, swollen and pale.     Left Turbinates: Enlarged, swollen and pale.     Right Sinus: No maxillary sinus tenderness or frontal sinus tenderness.     Left Sinus: No maxillary sinus tenderness or frontal sinus tenderness.     Mouth/Throat:     Lips: Pink.     Mouth: Mucous membranes are moist. Mucous membranes are pale and not dry.     Pharynx: Uvula midline.  Eyes:     General: Lids are normal. Allergic shiner present.        Right eye: No discharge.        Left eye: No discharge.     Conjunctiva/sclera: Conjunctivae normal.     Right eye: Right conjunctiva is not injected. No chemosis.    Left eye: Left conjunctiva is not injected. No chemosis.    Pupils: Pupils  are equal, round, and reactive to light.  Cardiovascular:     Rate and Rhythm: Normal rate and regular rhythm.     Heart sounds: Normal heart sounds.  Pulmonary:     Effort: Pulmonary effort is normal. No tachypnea, accessory muscle usage or respiratory distress.     Breath sounds: Normal breath sounds. No wheezing, rhonchi or rales.  Chest:     Chest wall: No tenderness.  Abdominal:     Tenderness: There is no abdominal tenderness. There is no guarding or rebound.  Lymphadenopathy:     Head:     Right side of head: No submandibular, tonsillar or occipital adenopathy.     Left side of head: No submandibular, tonsillar or occipital adenopathy.     Cervical: No cervical adenopathy.  Skin:    Coloration: Skin is not pale.     Findings: No abrasion, erythema, petechiae or rash. Rash is not papular, urticarial or vesicular.  Neurological:     Mental Status: She is alert.  Psychiatric:        Behavior: Behavior is cooperative.      Diagnostic studies:   Allergy Studies:     Airborne Adult Perc - 10/30/21 1500     1. Control-Buffer 50% Glycerol Negative    2. Control-Histamine 1 mg/ml 2+    3. Albumin saline Negative    4. Bahia Negative    5. [place] Negative    6. Johnson Negative    7. Kentucky Blue Negative    8. Meadow Fescue Negative    9. Perennial Rye Negative    10. Sweet Vernal Negative    11. Timothy Negative    12. Cocklebur Negative    13. Burweed Marshelder Negative    14. Ragweed, short Negative    15. Ragweed, Giant Negative    16. Plantain,  English Negative    17. Lamb's Quarters Negative    18. Sheep Sorrell Negative    19. Rough Pigweed Negative    20. Marsh Elder, Rough Negative    21. Mugwort, Common Negative    22. Ash mix Negative    23. Birch mix Negative    24. Beech American Negative    25. Box, Elder Negative    26. Cyd Silence,  red Negative    27. Cottonwood, Guinea-Bissau Negative    28. Elm mix Negative    29. Hickory Negative    30. Maple mix  Negative    31. Oak, Guinea-[place]iss[place] mix Negative    32. Pecan Pollen Negative    33. Pine mix Negative    34. Sycamore Eastern Negative    35. Walnut, Black Pollen Negative    36. Alternaria alternata Negative    37. Cladosporium Herbarum Negative    38. Aspergillus mix Negative    39. Penicillium mix Negative    40. Bipolaris sorokiniana (Helminthosporium) Negative    41. Drechslera spicifera (Curvularia) Negative    42. Mucor plumbeus Negative    43. Fusarium moniliforme Negative    44. Aureobasidium pullulans (pullulara) Negative    45. Rhizopus oryzae Negative    46. Botrytis cinera Negative    47. Epicoccum nigrum Negative    48. Phoma betae Negative    49. Candida Albicans Negative    50. Trichophyton mentagrophytes Negative    51. Mite, D Farinae  5,000 AU/ml Negative    52. Mite, D Pteronyssinus  5,000 AU/ml Negative    53. Cat Hair 10,000 BAU/ml Negative    54.  Dog Epithelia Negative    55. Mixed Feathers Negative    56. Horse Epithelia Negative    57. Cockroach, German Negative    58. Mouse Negative    59. Tobacco Leaf Negative             Food Perc - 10/30/21 1525       Test Information   Time Antigen Placed 1500    Allergen Manufacturer Greer    Location Back    Number of allergen test 10      Food   1. Peanut Negative    2. Soybean food Negative    3. Wheat, whole Negative    4. Sesame Negative    5. Milk, cow Negative    6. Egg White, chicken Negative    7. Casein Negative    8. Shellfish mix Negative    9. Fish mix Negative    10. Cashew Negative             Allergy testing results were read and interpreted by myself, documented by clinical staff.         Malachi Bonds, MD [facility]

## 2021-10-30 NOTE — Patient Instructions (Addendum)
1. Chronic rhinitis with headaches - Testing today showed: negative to the entire panel - Copy of test results provided.  - Start taking: Zyrtec (cetirizine) 10mg  tablet once daily and Flonase (fluticasone) one spray per nostril daily (AIM FOR EAR ON EACH SIDE) - If this does not help, I would stop after ONE MONTH.  - You can use an extra dose of the antihistamine, if needed, for breakthrough symptoms.  - Consider nasal saline rinses 1-2 times daily to remove allergens from the nasal cavities as well as help with mucous clearance (this is especially helpful to do before the nasal sprays are given)  2. Food intolerance - Testing was negative to the most common foods. - There is a the low positive predictive value of food allergy testing and hence the high possibility of false positives. - In contrast, food allergy testing has a high negative predictive value, therefore if testing is negative we can be relatively assured that they are indeed negative.  - Copy of testing results provided.   3. Return in about 3 months (around 01/30/2022).    Please inform 02/01/2022 of any Emergency Department visits, hospitalizations, or changes in symptoms. Call us before going to the ED for breathing or allergy symptoms since we might be able to fit you in for a sick visit. Feel free to contact us anytime with any questions, problems, or concerns.  It was a pleasure to meet you today!  Websites that have reliable patient information: 1. American Academy of Asthma, Allergy, and Immunology: www.aaaai.org 2. Food Allergy Research and Education (FARE): foodallergy.org 3. Mothers of Asthmatics: http://www.asthmacommunitynetwork.org 4. American College of Allergy, Asthma, and Immunology: www.acaai.org   COVID-19 Vaccine Information can be found at: [place] For questions related to vaccine distribution or appointments, please email  [email or web address] or call [phone].   We realize that you might be concerned about having an allergic reaction to the COVID19 vaccines. To help with that concern, WE ARE OFFERING THE COVID19 VACCINES IN OUR OFFICE! Ask the front desk for dates!     "Like" [phone] on Facebook and Instagram for our latest updates!      A healthy democracy works best when [place] participate! Make sure you are registered to vote! If you have moved or changed any of your contact information, you will need to get this updated before voting!  In some cases, you MAY be able to register to vote online: [employer]      Airborne Adult Perc - 10/30/21 1500     1. Control-Buffer 50% Glycerol Negative    2. Control-Histamine 1 mg/ml 2+    3. Albumin saline Negative    4. Bahia Negative    5. 11/01/21 Negative    6. Johnson Negative    7. Kentucky Blue Negative    8. Meadow Fescue Negative    9. Perennial Rye Negative    10. Sweet Vernal Negative    11. Timothy Negative    12. Cocklebur Negative    13. Burweed Marshelder Negative    14. Ragweed, short Negative    15. Ragweed, Giant Negative    16. Plantain,  English Negative    17. Lamb's Quarters Negative    18. Sheep Sorrell Negative    19. Rough Pigweed Negative    20. Marsh Elder, Rough Negative    21. Mugwort, Common Negative    22. Ash mix Negative    23. Birch mix Negative    24. Beech American Negative    25.  Box, Elder Negative    26. Cedar, red Negative    27. Cottonwood, Guinea-Bissau Negative    28. Elm mix Negative    29. Hickory Negative    30. Maple mix Negative    31. Oak, [place] mix Negative    32. Pecan Pollen Negative    33. Pine mix Negative    34. Sycamore Eastern Negative    35. Walnut, Black Pollen Negative    36. Alternaria alternata Negative    37. Cladosporium Herbarum Negative    38. Aspergillus mix Negative    39. Penicillium mix Negative    40. Bipolaris sorokiniana  (Helminthosporium) Negative    41. Drechslera spicifera (Curvularia) Negative    42. Mucor plumbeus Negative    43. Fusarium moniliforme Negative    44. Aureobasidium pullulans (pullulara) Negative    45. Rhizopus oryzae Negative    46. Botrytis cinera Negative    47. Epicoccum nigrum Negative    48. Phoma betae Negative    49. Candida Albicans Negative    50. Trichophyton mentagrophytes Negative    51. Mite, D Farinae  5,000 AU/ml Negative    52. Mite, D Pteronyssinus  5,000 AU/ml Negative    53. Cat Hair 10,000 BAU/ml Negative    54.  Dog Epithelia Negative    55. Mixed Feathers Negative    56. Horse Epithelia Negative    57. Cockroach, German Negative    58. Mouse Negative    59. Tobacco Leaf Negative             Food Perc - 10/30/21 1525       Test Information   Time Antigen Placed 1500    Allergen Manufacturer Greer    Location Back    Number of allergen test 10      Food   1. Peanut Negative    2. Soybean food Negative    3. Wheat, whole Negative    4. Sesame Negative    5. Milk, cow Negative    6. Egg White, chicken Negative    7. Casein Negative    8. Shellfish mix Negative    9. Fish mix Negative    10. Cashew Negative

## 2022-02-05 ENCOUNTER — Ambulatory Visit: Payer: 59 | Admitting: Allergy & Immunology

## 2022-03-16 NOTE — Patient Instructions (Incomplete)
1. Chronic rhinitis with headaches - testing negative to the entire panel on 10/30/21 -recommend stopping the herbal nasal spray that you are using - stop Zyrtec (cetirizine) and Flonase (fluticasone) since they were not helpful. She reports that she has tried other nasal sprays such as Nasacort and Nasonex  and they were ineffective. - Schedule an appointment for intradermals. You will need to be off all antihistamines 3 days prior to this appointment - You can use an extra dose of the antihistamine, if needed, for breakthrough symptoms.  - Consider nasal saline rinses 1-2 times daily to remove allergens from the nasal cavities as well as help with mucous clearance (this is especially helpful to do before the nasal sprays are given) -May use saline nasal gel for nasal dryness  2. Food intolerance - Testing was negative to the most common foods on 10/30/21. - There is a the low positive predictive value of food allergy testing and hence the high possibility of false positives. - In contrast, food allergy testing has a high negative predictive value, therefore if testing is negative we can be relatively assured that they are indeed negative.    Recommend going to the ER due to seeing blood when you vomited earlier today. Also, recommend speaking with your primary care physician about the occasional chest pain  3. Schedule a follow up appointment for intradermal skin testing

## 2022-03-18 ENCOUNTER — Ambulatory Visit (INDEPENDENT_AMBULATORY_CARE_PROVIDER_SITE_OTHER): Payer: 59 | Admitting: Family

## 2022-03-18 ENCOUNTER — Encounter: Payer: Self-pay | Admitting: Family

## 2022-03-18 VITALS — BP 90/60

## 2022-03-18 DIAGNOSIS — R519 Headache, unspecified: Secondary | ICD-10-CM

## 2022-03-18 DIAGNOSIS — J310 Chronic rhinitis: Secondary | ICD-10-CM | POA: Diagnosis not present

## 2022-03-18 DIAGNOSIS — K9049 Malabsorption due to intolerance, not elsewhere classified: Secondary | ICD-10-CM | POA: Diagnosis not present

## 2022-03-18 NOTE — Progress Notes (Signed)
[place] [address] Dept: [phone]  FOLLOW UP NOTE  Patient ID: Mahira Petras Gancarz, female    DOB: October 31, 1990  Age: 31 y.o. MRN: [id number] Date of Office Visit: 03/18/2022  Assessment  Chief Complaint: chronic rhinitis (Still having sinus issues)  HPI 59 T Yom is a 31 year old female who presents today for follow-up of chronic rhinitis with headaches and food intolerance.  She was last seen on November 07, 2021 by Dr. Ernst Bowler.  She denies any new diagnosis or surgery since her last office visit.  A translator is available today via [employer] and helps translate.  Chronic rhinitis with headaches.  She reports that she is no longer taking Zyrtec 10 mg once a day and Flonase nasal spray.  She mentions that these medications did not help with her symptoms.  She has tried all nasal sprays in the past and they have not helped.  She reports clear rhinorrhea, nasal congestion, she will also have pain in her nose.  Also, her sinuses will feel really dry and sometimes bleed.  She denies postnasal drip.  She has not had any sinus infections since we last saw her.  She is currently using a nose spray from [place] that she uses 2 sprays each nostril twice a day.  This nose spray has helped better than what she has used in the past.  Per the translator, "the ingredients are all herbal and no chemical."  She denies sinus pressure.  She has migraines under her eyes and is sensitive with smells.  She is able to taste and smell.  She had a CT face/sinuses without contrast on July 24, 2021 showing:  "IMPRESSION:  Mild mucosal thickening of both maxillary sinuses without layering  fluid. Mucosal thickening of the diminutive right division of the  sphenoid sinus. "  She mentions that while in the office today she threw up due to having a migraine and being sensitive to light.  When she threw up she did see a little bit of blood.  Recommended that she go to the emergency room.   Drug Allergies:   No Known Allergies  Review of Systems: Review of Systems  Constitutional:  Positive for chills. Negative for fever.       Reports that her hands and feet always stay cold  HENT:         Reports clear rhinorrhea, nasal congestion, and dry nose  Eyes:        Denies itchy watery eyes  Respiratory:  Negative for cough, shortness of breath and wheezing.   Cardiovascular:  Positive for palpitations. Negative for chest pain.  Gastrointestinal:        Reports reflux sometimes and she has been on different medications for this for 2 to 5 years and it did not really help.  The frequency depends on her vestibular nerve  Skin:  Negative for rash.       Reports dry skin that sometimes crack on the back of her head  Neurological:  Positive for headaches.       Has migraines  Endo/Heme/Allergies:  Negative for environmental allergies.     Physical Exam: BP 90/60 (BP Location: Left Arm, Patient Position: Sitting, Cuff Size: Normal)    Physical Exam Constitutional:      Appearance: Normal appearance.  HENT:     Head: Normocephalic and atraumatic.     Comments: Pharynx normal, eyes normal, ears normal, nose: Bilateral lower turbinates mildly edematous and slightly erythematous with no drainage  noted    Right Ear: Tympanic membrane, ear canal and external ear normal.     Left Ear: Tympanic membrane, ear canal and external ear normal.     Mouth/Throat:     Mouth: Mucous membranes are moist.     Pharynx: Oropharynx is clear.  Eyes:     Conjunctiva/sclera: Conjunctivae normal.  Cardiovascular:     Rate and Rhythm: Regular rhythm. Tachycardia present.     Heart sounds: Normal heart sounds.  Pulmonary:     Effort: Pulmonary effort is normal.     Breath sounds: Normal breath sounds.     Comments: Lungs clear to auscultation Musculoskeletal:     Cervical back: Neck supple.  Skin:    General: Skin is warm.     Comments: Small amount of dry skin noted on scalp  Neurological:     Mental  Status: She is alert and oriented to person, place, and time.  Psychiatric:        Mood and Affect: Mood normal.        Behavior: Behavior normal.        Thought Content: Thought content normal.        Judgment: Judgment normal.     Diagnostics: None  Assessment and Plan: 1. Chronic rhinitis   2. Nonintractable headache, unspecified chronicity pattern, unspecified headache type   3. Food intolerance     No orders of the defined types were placed in this encounter.   Patient Instructions  1. Chronic rhinitis with headaches - testing negative to the entire panel on 10/30/21 -recommend stopping the herbal nasal spray that you are using - stop Zyrtec (cetirizine) and Flonase (fluticasone) since they were not helpful. She reports that she has tried other nasal sprays such as Nasacort and Nasonex  and they were ineffective. - Schedule an appointment for intradermals. You will need to be off all antihistamines 3 days prior to this appointment - You can use an extra dose of the antihistamine, if needed, for breakthrough symptoms.  - Consider nasal saline rinses 1-2 times daily to remove allergens from the nasal cavities as well as help with mucous clearance (this is especially helpful to do before the nasal sprays are given) -May use saline nasal gel for nasal dryness  2. Food intolerance - Testing was negative to the most common foods on 10/30/21. - There is a the low positive predictive value of food allergy testing and hence the high possibility of false positives. - In contrast, food allergy testing has a high negative predictive value, therefore if testing is negative we can be relatively assured that they are indeed negative.    Recommend going to the ER due to seeing blood when you vomited earlier today. Also, recommend speaking with your primary care physician about the occasional chest pain  3. Schedule a follow up appointment for intradermal skin  testing                 Return in 1 week (on 03/25/2022), or if symptoms worsen or fail to improve, for skin testing-intradermals.    Thank you for the opportunity to care for this patient.  Please do not hesitate to contact me with questions.  Nehemiah Settle, FNP [facility]

## 2022-03-25 ENCOUNTER — Encounter: Payer: Self-pay | Admitting: Family Medicine

## 2022-03-25 ENCOUNTER — Ambulatory Visit: Payer: 59 | Admitting: Family Medicine

## 2022-03-25 VITALS — BP 90/60 | HR 79 | Temp 97.6°F | Resp 20 | Wt 95.5 lb

## 2022-03-25 DIAGNOSIS — R1310 Dysphagia, unspecified: Secondary | ICD-10-CM | POA: Diagnosis not present

## 2022-03-25 DIAGNOSIS — J3089 Other allergic rhinitis: Secondary | ICD-10-CM | POA: Diagnosis not present

## 2022-03-25 DIAGNOSIS — R042 Hemoptysis: Secondary | ICD-10-CM

## 2022-03-25 DIAGNOSIS — R052 Subacute cough: Secondary | ICD-10-CM | POA: Diagnosis not present

## 2022-03-25 DIAGNOSIS — R519 Headache, unspecified: Secondary | ICD-10-CM

## 2022-03-25 NOTE — Patient Instructions (Addendum)
Allergic rhinitis Your environmental allergy skin testing was positive to mold mix 4, dust mite, dog, and borderline positive to cat.   Allergen avoidance measures are listed below Continue an antihistamine once a day as needed for runny nose or itch Continue Flonase 2 sprays in each nostril once a day as needed for a stuffy nose Consider saline nasal rinses as needed for nasal symptoms. Use this before any medicated nasal sprays for best result Consider allergen immunotherapy if the medications as listed above are not controlling your symptoms of allergic rhinitis.  Make an appointment for your first allergy injection if you are interested in this option  Cough/dysphagia/hemoptysis Suggest referral to GI specialist for further evaluation. Go to the emergency department for any further vomiting of blood.   Headache Continue to follow up with your neurologist for headache evaluation and treatment If your symptoms re-occur, begin a journal of events that occurred for up to 6 hours before your symptoms began including foods and beverages consumed, soaps or perfumes you had contact with, and medications.   Food intolerance Continue to avoid foods that cause joint pain  Call the clinic if this treatment plan is not working well for you.  Follow up in 2 months or sooner if needed.  Control of Mold Allergen Mold and fungi can grow on a variety of surfaces provided certain temperature and moisture conditions exist.  Outdoor molds grow on plants, decaying vegetation and soil.  The major outdoor mold, Alternaria and Cladosporium, are found in very high numbers during hot and dry conditions.  Generally, a late Summer - Fall peak is seen for common outdoor fungal spores.  Rain will temporarily lower outdoor mold spore count, but counts rise rapidly when the rainy period ends.  The most important indoor molds are Aspergillus and Penicillium.  Dark, humid and poorly ventilated basements are ideal sites for  mold growth.  The next most common sites of mold growth are the bathroom and the kitchen.  Outdoor [employer] Use air conditioning and keep windows closed Avoid exposure to decaying vegetation. Avoid leaf raking. Avoid grain handling. Consider wearing a face mask if working in moldy areas.  Indoor Mold Control Maintain humidity below 50%. Clean washable surfaces with 5% bleach solution. Remove sources e.g. Contaminated carpets.   Control of Dust Mite Allergen Dust mites play a major role in allergic asthma and rhinitis. They occur in environments with high humidity wherever human skin is found. Dust mites absorb humidity from the atmosphere (ie, they do not drink) and feed on organic matter (including shed human and animal skin). Dust mites are a microscopic type of insect that you cannot see with the naked eye. High levels of dust mites have been detected from mattresses, pillows, carpets, upholstered furniture, bed covers, clothes, soft toys and any woven material. The principal allergen of the dust mite is found in its feces. A gram of dust may contain 1,000 mites and 250,000 fecal particles. Mite antigen is easily measured in the air during house cleaning activities. Dust mites do not bite and do not cause harm to humans, other than by triggering allergies/asthma.  Ways to decrease your exposure to dust mites in your home:  1. Encase mattresses, box springs and pillows with a mite-impermeable barrier or cover  2. Wash sheets, blankets and drapes weekly in hot water (130 F) with detergent and dry them in a dryer on the hot setting.  3. Have the room cleaned frequently with a vacuum cleaner and a  damp dust-mop. For carpeting or rugs, vacuuming with a vacuum cleaner equipped with a high-efficiency particulate air (HEPA) filter. The dust mite allergic individual should not be in a room which is being cleaned and should wait 1 hour after cleaning before going into the room.  4. Do not  sleep on upholstered furniture (eg, couches).  5. If possible removing carpeting, upholstered furniture and drapery from the home is ideal. Horizontal blinds should be eliminated in the rooms where the person spends the most time (bedroom, study, television room). Washable vinyl, roller-type shades are optimal.  6. Remove all non-washable stuffed toys from the bedroom. Wash stuffed toys weekly like sheets and blankets above.  7. Reduce indoor humidity to less than 50%. Inexpensive humidity monitors can be purchased at most hardware stores. Do not use a humidifier as can make the problem worse and are not recommended.

## 2022-03-25 NOTE — Progress Notes (Signed)
[address] [place] [address] Dept: [phone]  FOLLOW UP NOTE  Patient ID: Theresa Garza, female    DOB: 01-Jul-1990  Age: 31 y.o. MRN: [id number] Date of Office Visit: 03/25/2022  Assessment  Chief Complaint: Allergy Testing  HPI Theresa Garza is a 31 year old female who presents to the clinic for a follow-up visit with intradermal environmental allergy testing.  She was last seen in this clinic on 03/18/2022 by Nehemiah Settle, FNP, for evaluation of chronic rhinitis, headache, and food intolerance.  At today's visit, she reports allergic rhinitis has been moderately well controlled with symptoms including clear rhinorrhea, sneezing, and occasional nasal congestion.  She is using an herbal nasal spray with moderate relief of symptoms at this time.  She reports that she has tried antihistamines with no relief of symptoms as well as steroid nasal sprays with no relief.  Her last allergy skin testing was on 10/30/2021 was negative to the percutaneous adult environmental panel. Cough is reported as moderately well controlled with symptoms occurring mainly when she is exposed to an odor such as cologne and also at night when lying down. She reports post tussive vomiting about twice a week for the last several months which occasionally contains dried blood. She continues to see Dr. Richardson Landry, ENT specialist for chronic cough where she has recently had a CT scan of face/sinuses with results listed below. She reports experiencing heartburn and vomiting about 2 times a week. She has been prescribed omeprazole, however, is not currently taking this medication. She reports that she is not avoiding any particular foods at this time, however, she does report that she has trouble swallowing small foods and they tend to get stuck in her throat which induces more coughing and occasional posttussive vomiting. She reports some foods cause her to have aching in her joints such as pork. She has previously been under  the care of a neurologist, however, she reports that she continues to experience headaches daily.  She points to her lower neck and above both eyes when asked to identify the areas where pain occurs. She has tried therapy with trigger point injections, Fioricet, and physical therapy with some moderate decrease in her symptoms of headache. Her current medications are listed in the chart.   CT Face/sinuses WO contrast 07/24/2021 MPRESSION: Mild mucosal thickening of both maxillary sinuses without layering fluid. Mucosal thickening of the diminutive right division of the sphenoid sinus.   Drug Allergies:  No Known Allergies  Physical Exam: BP 90/60   Pulse 79   Temp 97.6 F (36.4 C)   Resp 20   Wt 95 lb 8 oz (43.3 kg)   SpO2 100%   BMI 20.67 kg/m    Physical Exam Vitals reviewed.  Constitutional:      Appearance: Normal appearance.  HENT:     Head: Normocephalic and atraumatic.     Right Ear: Tympanic membrane normal.     Left Ear: Tympanic membrane normal.     Nose:     Comments: Bilateral nares edematous and pale with clear nasal drainage noted.  Pharynx slightly erythematous with no exudate.  Ears normal.  Eyes normal. Eyes:     Conjunctiva/sclera: Conjunctivae normal.  Cardiovascular:     Rate and Rhythm: Normal rate and regular rhythm.     Heart sounds: Normal heart sounds. No murmur heard. Pulmonary:     Effort: Pulmonary effort is normal.     Breath sounds: Normal breath sounds.  Comments: Lungs clear to auscultation Musculoskeletal:        General: Normal range of motion.     Cervical back: Normal range of motion and neck supple.  Skin:    General: Skin is warm and dry.  Neurological:     Mental Status: She is alert and oriented to person, place, and time.  Psychiatric:        Mood and Affect: Mood normal.        Behavior: Behavior normal.        Thought Content: Thought content normal.        Judgment: Judgment normal.    Diagnostics: Intradermal  testing positive to mold mix 4, dust mite, and dog, and borderline positive to cat with an adequate control.  Assessment and Plan: 1. Perennial allergic rhinitis   2. Subacute cough   3. Dysphagia, unspecified type   4. Hemoptysis   5. Nonintractable headache, unspecified chronicity pattern, unspecified headache type     No orders of the defined types were placed in this encounter.   Patient Instructions  Allergic rhinitis Your environmental allergy skin testing was positive to mold mix 4 and dust mite.  Allergen avoidance measures are listed below Continue an antihistamine once a day as needed for runny nose or itch Continue Flonase 2 sprays in each nostril once a day as needed for a stuffy nose Consider saline nasal rinses as needed for nasal symptoms. Use this before any medicated nasal sprays for best result Consider allergen immunotherapy if the medications as listed above are not controlling your symptoms of allergic rhinitis.  Make an appointment for your first allergy injection if you are interested in this option  Cough/dysphagia/hemoptysis Suggest referral to GI specialist for further evaluation. Go to the emergency department for any further vomiting of blood.   Headache If your symptoms re-occur, begin a journal of events that occurred for up to 6 hours before your symptoms began including foods and beverages consumed, soaps or perfumes you had contact with, and medications.   Food intolerance Continue to avoid foods that cause joint pain  Call the clinic if this treatment plan is not working well for you.  Follow up in 2 months or sooner if needed.   Return in about 2 months (around 05/25/2022), or if symptoms worsen or fail to improve.    Thank you for the opportunity to care for this patient.  Please do not hesitate to contact me with questions.  Thermon Leyland, FNP [facility]

## 2022-04-02 ENCOUNTER — Telehealth: Payer: Self-pay

## 2022-04-02 NOTE — Telephone Encounter (Signed)
Patient stopped by today to move forward with scheduling allergy shots. She has signed the estimate form and consent for admin form. I have placed them in the nurses station to be scanned. She is scheduled for 04/29/22 in [place]

## 2022-04-02 NOTE — Telephone Encounter (Signed)
Thank you :)

## 2022-04-08 ENCOUNTER — Telehealth: Payer: Self-pay | Admitting: Allergy & Immunology

## 2022-04-08 DIAGNOSIS — J3089 Other allergic rhinitis: Secondary | ICD-10-CM

## 2022-04-08 NOTE — Telephone Encounter (Signed)
Scripts written for allergen immunotherapy.   Theresa Bonds, MD [facility]

## 2022-04-09 NOTE — Telephone Encounter (Signed)
I got your two staff messages about this as well. Thanks, Marylu Lund!   Malachi Bonds, MD [facility]

## 2022-04-10 DIAGNOSIS — J3081 Allergic rhinitis due to animal (cat) (dog) hair and dander: Secondary | ICD-10-CM

## 2022-04-10 NOTE — Progress Notes (Signed)
Aeroallergen Immunotherapy   Ordering Provider: Dr. Malachi Bonds   Patient Details  Name: Theresa Garza  MRN: [id number]  Date of Birth: 1991/05/12   Order 2 of 2   Vial Label: Molds/DM   0.2 ml (Volume)  1:10 Concentration -- Fusarium moniliforme  0.2 ml (Volume)  1:40 Concentration -- Aureobasidium pullulans  0.2 ml (Volume)  1:10 Concentration -- Rhizopus oryzae  0.8 ml (Volume)   AU Concentration -- Mite Mix (DF 5,000 & DP 5,000)    1.4  ml Extract Subtotal  3.6  ml Diluent  5.0  ml Maintenance Total   Schedule:  B   Blue Vial (1:100,000): Schedule B (6 doses)  Yellow Vial (1:10,000): Schedule B (6 doses)  Green Vial (1:1,000): Schedule B (6 doses)  Red Vial (1:100): Schedule A (14 doses)   Special Instructions: none

## 2022-04-10 NOTE — Progress Notes (Signed)
VIALS EXP 04-11-23 

## 2022-04-10 NOTE — Progress Notes (Signed)
Aeroallergen Immunotherapy  Ordering Provider: Dr. Malachi Bonds  Patient Details Name: Theresa Garza MRN: [id number] Date of Birth: 28-Nov-1990  Order 1 of 2  Vial Label: C/D  0.8 ml (Volume)  1:10 Concentration -- Cat Hair 0.8 ml (Volume)  1:10 Concentration -- Dog Epithelia   1.6  ml Extract Subtotal 3.4  ml Diluent 5.0  ml Maintenance Total  Schedule:  B Silver Vial (1:1,000,000): Blue Vial (1:100,000): Schedule B (6 doses) Yellow Vial (1:10,000): Schedule B (6 doses) Green Vial (1:1,000): Schedule B (6 doses) Red Vial (1:100): Schedule A (14 doses)  Special Instructions: none

## 2022-04-11 DIAGNOSIS — J3089 Other allergic rhinitis: Secondary | ICD-10-CM

## 2022-04-29 ENCOUNTER — Ambulatory Visit (INDEPENDENT_AMBULATORY_CARE_PROVIDER_SITE_OTHER): Payer: 59

## 2022-04-29 DIAGNOSIS — J309 Allergic rhinitis, unspecified: Secondary | ICD-10-CM | POA: Diagnosis not present

## 2022-04-29 NOTE — Progress Notes (Signed)
Immunotherapy   Patient Details  Name: Theresa Garza MRN: [id number] Date of Birth: 06-17-1990  04/29/2022  Laelle T Rehberg started injections for  Allergy Injections  Following schedule: B  Frequency:1 time per week Epi-Pen:Epi-Pen Available  Consent signed and patient instructions given.  Patient was given 1 tablet of allegra and both allergy shots without any issues. She waited the 30 minutes in office and both arms were clear of a potential reaction. Patient place to come in either Wed., Thurs., or Friday for her next shot since we are closed for christmas Monday and Tuesday.   Orson Aloe 04/29/2022, 10:45 AM

## 2022-05-08 ENCOUNTER — Ambulatory Visit (INDEPENDENT_AMBULATORY_CARE_PROVIDER_SITE_OTHER): Payer: 59

## 2022-05-08 DIAGNOSIS — J309 Allergic rhinitis, unspecified: Secondary | ICD-10-CM

## 2022-05-08 MED ORDER — EPINEPHRINE 0.3 MG/0.3ML IJ SOAJ: 0.3000 mg | Freq: Once | INTRAMUSCULAR | 0 refills | Status: AC

## 2022-05-14 ENCOUNTER — Ambulatory Visit (INDEPENDENT_AMBULATORY_CARE_PROVIDER_SITE_OTHER): Payer: 59

## 2022-05-14 DIAGNOSIS — J309 Allergic rhinitis, unspecified: Secondary | ICD-10-CM

## 2022-05-20 ENCOUNTER — Ambulatory Visit (INDEPENDENT_AMBULATORY_CARE_PROVIDER_SITE_OTHER): Payer: 59

## 2022-05-20 DIAGNOSIS — J309 Allergic rhinitis, unspecified: Secondary | ICD-10-CM

## 2022-05-27 ENCOUNTER — Ambulatory Visit (INDEPENDENT_AMBULATORY_CARE_PROVIDER_SITE_OTHER): Payer: 59

## 2022-05-27 DIAGNOSIS — J309 Allergic rhinitis, unspecified: Secondary | ICD-10-CM | POA: Diagnosis not present

## 2022-06-03 ENCOUNTER — Ambulatory Visit (INDEPENDENT_AMBULATORY_CARE_PROVIDER_SITE_OTHER): Payer: 59

## 2022-06-03 DIAGNOSIS — J309 Allergic rhinitis, unspecified: Secondary | ICD-10-CM | POA: Diagnosis not present

## 2022-06-10 ENCOUNTER — Ambulatory Visit (INDEPENDENT_AMBULATORY_CARE_PROVIDER_SITE_OTHER): Payer: 59

## 2022-06-10 DIAGNOSIS — J309 Allergic rhinitis, unspecified: Secondary | ICD-10-CM

## 2022-06-17 ENCOUNTER — Ambulatory Visit (INDEPENDENT_AMBULATORY_CARE_PROVIDER_SITE_OTHER): Payer: 59

## 2022-06-17 DIAGNOSIS — J309 Allergic rhinitis, unspecified: Secondary | ICD-10-CM | POA: Diagnosis not present

## 2022-06-24 ENCOUNTER — Ambulatory Visit (INDEPENDENT_AMBULATORY_CARE_PROVIDER_SITE_OTHER): Payer: 59 | Admitting: *Deleted

## 2022-06-24 DIAGNOSIS — J309 Allergic rhinitis, unspecified: Secondary | ICD-10-CM

## 2022-07-02 ENCOUNTER — Ambulatory Visit (INDEPENDENT_AMBULATORY_CARE_PROVIDER_SITE_OTHER): Payer: 59

## 2022-07-02 DIAGNOSIS — J309 Allergic rhinitis, unspecified: Secondary | ICD-10-CM | POA: Diagnosis not present

## 2022-07-08 ENCOUNTER — Ambulatory Visit (INDEPENDENT_AMBULATORY_CARE_PROVIDER_SITE_OTHER): Payer: 59 | Admitting: *Deleted

## 2022-07-08 DIAGNOSIS — J309 Allergic rhinitis, unspecified: Secondary | ICD-10-CM | POA: Diagnosis not present

## 2022-07-15 ENCOUNTER — Ambulatory Visit (INDEPENDENT_AMBULATORY_CARE_PROVIDER_SITE_OTHER): Payer: 59 | Admitting: *Deleted

## 2022-07-15 DIAGNOSIS — J309 Allergic rhinitis, unspecified: Secondary | ICD-10-CM

## 2022-07-22 ENCOUNTER — Ambulatory Visit (INDEPENDENT_AMBULATORY_CARE_PROVIDER_SITE_OTHER): Payer: 59 | Admitting: *Deleted

## 2022-07-22 DIAGNOSIS — J309 Allergic rhinitis, unspecified: Secondary | ICD-10-CM | POA: Diagnosis not present

## 2022-07-30 ENCOUNTER — Ambulatory Visit (INDEPENDENT_AMBULATORY_CARE_PROVIDER_SITE_OTHER): Payer: 59

## 2022-07-30 DIAGNOSIS — J309 Allergic rhinitis, unspecified: Secondary | ICD-10-CM

## 2022-08-05 ENCOUNTER — Ambulatory Visit (INDEPENDENT_AMBULATORY_CARE_PROVIDER_SITE_OTHER): Payer: 59 | Admitting: *Deleted

## 2022-08-05 DIAGNOSIS — J309 Allergic rhinitis, unspecified: Secondary | ICD-10-CM | POA: Diagnosis not present

## 2022-08-12 ENCOUNTER — Ambulatory Visit (INDEPENDENT_AMBULATORY_CARE_PROVIDER_SITE_OTHER): Payer: 59 | Admitting: *Deleted

## 2022-08-12 DIAGNOSIS — J309 Allergic rhinitis, unspecified: Secondary | ICD-10-CM | POA: Diagnosis not present

## 2022-08-19 ENCOUNTER — Ambulatory Visit (INDEPENDENT_AMBULATORY_CARE_PROVIDER_SITE_OTHER): Payer: 59 | Admitting: *Deleted

## 2022-08-19 DIAGNOSIS — J309 Allergic rhinitis, unspecified: Secondary | ICD-10-CM

## 2022-08-26 ENCOUNTER — Ambulatory Visit (INDEPENDENT_AMBULATORY_CARE_PROVIDER_SITE_OTHER): Payer: 59

## 2022-08-26 DIAGNOSIS — J309 Allergic rhinitis, unspecified: Secondary | ICD-10-CM

## 2022-09-02 ENCOUNTER — Ambulatory Visit (INDEPENDENT_AMBULATORY_CARE_PROVIDER_SITE_OTHER): Payer: 59

## 2022-09-02 DIAGNOSIS — J309 Allergic rhinitis, unspecified: Secondary | ICD-10-CM

## 2022-09-09 ENCOUNTER — Ambulatory Visit (INDEPENDENT_AMBULATORY_CARE_PROVIDER_SITE_OTHER): Payer: 59

## 2022-09-09 DIAGNOSIS — J309 Allergic rhinitis, unspecified: Secondary | ICD-10-CM

## 2022-09-16 ENCOUNTER — Ambulatory Visit (INDEPENDENT_AMBULATORY_CARE_PROVIDER_SITE_OTHER): Payer: 59 | Admitting: *Deleted

## 2022-09-16 DIAGNOSIS — J309 Allergic rhinitis, unspecified: Secondary | ICD-10-CM

## 2022-09-23 ENCOUNTER — Ambulatory Visit (INDEPENDENT_AMBULATORY_CARE_PROVIDER_SITE_OTHER): Payer: 59 | Admitting: *Deleted

## 2022-09-23 DIAGNOSIS — J309 Allergic rhinitis, unspecified: Secondary | ICD-10-CM

## 2022-09-30 ENCOUNTER — Ambulatory Visit (INDEPENDENT_AMBULATORY_CARE_PROVIDER_SITE_OTHER): Payer: 59 | Admitting: *Deleted

## 2022-09-30 DIAGNOSIS — J309 Allergic rhinitis, unspecified: Secondary | ICD-10-CM | POA: Diagnosis not present

## 2022-10-08 ENCOUNTER — Ambulatory Visit (INDEPENDENT_AMBULATORY_CARE_PROVIDER_SITE_OTHER): Payer: 59 | Admitting: *Deleted

## 2022-10-08 DIAGNOSIS — J309 Allergic rhinitis, unspecified: Secondary | ICD-10-CM

## 2022-10-14 ENCOUNTER — Ambulatory Visit (INDEPENDENT_AMBULATORY_CARE_PROVIDER_SITE_OTHER): Payer: 59 | Admitting: *Deleted

## 2022-10-14 DIAGNOSIS — J309 Allergic rhinitis, unspecified: Secondary | ICD-10-CM

## 2022-10-21 ENCOUNTER — Ambulatory Visit (INDEPENDENT_AMBULATORY_CARE_PROVIDER_SITE_OTHER): Payer: 59 | Admitting: *Deleted

## 2022-10-21 ENCOUNTER — Other Ambulatory Visit: Payer: Self-pay

## 2022-10-21 ENCOUNTER — Ambulatory Visit: Payer: 59 | Admitting: Family Medicine

## 2022-10-21 ENCOUNTER — Encounter: Payer: Self-pay | Admitting: Family Medicine

## 2022-10-21 VITALS — BP 100/60 | HR 88 | Temp 98.2°F | Resp 20 | Wt 93.4 lb

## 2022-10-21 DIAGNOSIS — J309 Allergic rhinitis, unspecified: Secondary | ICD-10-CM | POA: Diagnosis not present

## 2022-10-21 DIAGNOSIS — K9049 Malabsorption due to intolerance, not elsewhere classified: Secondary | ICD-10-CM | POA: Insufficient documentation

## 2022-10-21 DIAGNOSIS — H1010 Acute atopic conjunctivitis, unspecified eye: Secondary | ICD-10-CM

## 2022-10-21 DIAGNOSIS — H1013 Acute atopic conjunctivitis, bilateral: Secondary | ICD-10-CM | POA: Diagnosis not present

## 2022-10-21 DIAGNOSIS — J3089 Other allergic rhinitis: Secondary | ICD-10-CM | POA: Diagnosis not present

## 2022-10-21 DIAGNOSIS — R519 Headache, unspecified: Secondary | ICD-10-CM | POA: Diagnosis not present

## 2022-10-21 NOTE — Patient Instructions (Signed)
Allergic rhinitis Continue allergen immunotherapy directed toward mold, dust mite and dog epinephrine autoinjector set per injection protocol Continue daily antihistamine as needed for runny nose or itch Continue Flonase 2 sprays in each nostril once a day as needed for stuffy nose Consider saline nasal rinses as needed for nasal symptoms. Use this before any medicated nasal sprays for best result  Allergic conjunctivitis Some over the counter eye drops include Pataday one drop in each eye once a day as needed for red, itchy eyes OR Zaditor one drop in each eye twice a day as needed for red itchy eyes. Avoid eye drops that say red eye relief as they may contain medications that dry out your eyes.   Headache Continue to follow-up with your neurologist If your symptoms re-occur, begin a journal of events that occurred for up to 6 hours before your symptoms began including foods and beverages consumed, soaps or perfumes you had contact with, and medications.   Food intolerance Continue to avoid foods that cause joint pain  Call the clinic if this treatment plan is not working well for you.  Follow up in 1 year or sooner if needed.  Control of Mold Allergen Mold and fungi can grow on a variety of surfaces provided certain temperature and moisture conditions exist.  Outdoor molds grow on plants, decaying vegetation and soil.  The major outdoor mold, Alternaria and Cladosporium, are found in very high numbers during hot and dry conditions.  Generally, a late Summer - Fall peak is seen for common outdoor fungal spores.  Rain will temporarily lower outdoor mold spore count, but counts rise rapidly when the rainy period ends.  The most important indoor molds are Aspergillus and Penicillium.  Dark, humid and poorly ventilated basements are ideal sites for mold growth.  The next most common sites of mold growth are the bathroom and the kitchen.  Outdoor [employer] Use air conditioning and keep  windows closed Avoid exposure to decaying vegetation. Avoid leaf raking. Avoid grain handling. Consider wearing a face mask if working in moldy areas.  Indoor Mold Control Maintain humidity below 50%. Clean washable surfaces with 5% bleach solution. Remove sources e.g. Contaminated carpets.   Control of Dust Mite Allergen Dust mites play a major role in allergic asthma and rhinitis. They occur in environments with high humidity wherever human skin is found. Dust mites absorb humidity from the atmosphere (ie, they do not drink) and feed on organic matter (including shed human and animal skin). Dust mites are a microscopic type of insect that you cannot see with the naked eye. High levels of dust mites have been detected from mattresses, pillows, carpets, upholstered furniture, bed covers, clothes, soft toys and any woven material. The principal allergen of the dust mite is found in its feces. A gram of dust may contain 1,000 mites and 250,000 fecal particles. Mite antigen is easily measured in the air during house cleaning activities. Dust mites do not bite and do not cause harm to humans, other than by triggering allergies/asthma.  Ways to decrease your exposure to dust mites in your home:  1. Encase mattresses, box springs and pillows with a mite-impermeable barrier or cover  2. Wash sheets, blankets and drapes weekly in hot water (130 F) with detergent and dry them in a dryer on the hot setting.  3. Have the room cleaned frequently with a vacuum cleaner and a damp dust-mop. For carpeting or rugs, vacuuming with a vacuum cleaner equipped with a high-efficiency particulate  air (HEPA) filter. The dust mite allergic individual should not be in a room which is being cleaned and should wait 1 hour after cleaning before going into the room.  4. Do not sleep on upholstered furniture (eg, couches).  5. If possible removing carpeting, upholstered furniture and drapery from the home is ideal.  Horizontal blinds should be eliminated in the rooms where the person spends the most time (bedroom, study, television room). Washable vinyl, roller-type shades are optimal.  6. Remove all non-washable stuffed toys from the bedroom. Wash stuffed toys weekly like sheets and blankets above.  7. Reduce indoor humidity to less than 50%. Inexpensive humidity monitors can be purchased at most hardware stores. Do not use a humidifier as can make the problem worse and are not recommended.  Control of Dog or Cat Allergen Avoidance is the best way to manage a dog or cat allergy. If you have a dog or cat and are allergic to dog or cats, consider removing the dog or cat from the home. If you have a dog or cat but don't want to find it a new home, or if your family wants a pet even though someone in the household is allergic, here are some strategies that may help keep symptoms at bay:  Keep the pet out of your bedroom and restrict it to only a few rooms. Be advised that keeping the dog or cat in only one room will not limit the allergens to that room. Don't pet, hug or kiss the dog or cat; if you do, wash your hands with soap and water. High-efficiency particulate air (HEPA) cleaners run continuously in a bedroom or living room can reduce allergen levels over time. Regular use of a high-efficiency vacuum cleaner or a central vacuum can reduce allergen levels. Giving your dog or cat a bath at least once a week can reduce airborne allergen.

## 2022-10-21 NOTE — Progress Notes (Signed)
[address] [place] [address] Dept: [phone]  FOLLOW UP NOTE  Patient ID: Theresa Garza, female    DOB: 1991-03-08  Age: 32 y.o. MRN: [id number] Date of Office Visit: 10/21/2022  Assessment  Chief Complaint: Follow-up  HPI Theresa Garza is a 32 year old female who presents to the clinic for follow-up visit.  She was last seen in this clinic on 03/25/2022 by Thermon Leyland, FNP, for evaluation of allergic rhinitis, cough, headache, and food intolerance.  At that time, her environmental allergy testing was positive to mold, dust mite, cat, dog and she began allergen immunotherapy on 04/29/2022.  At today's visit, she reports her allergic rhinitis has been moderately well-controlled with symptoms including occasional clear rhinorrhea, sneezing, and occasional postnasal drainage.  She continues an antihistamine as needed and is not currently using Flonase or nasal saline rinses.  She continues allergen immunotherapy directed toward mold, dust mite, cat, and dog with no large or local reactions.  She began allergen immunotherapy on 04/29/2022.  She reports a significant decrease in symptoms of allergic rhinitis while continuing allergen immunotherapy.  She reports her cough has resolved at this time.  She reports her epinephrine autoinjector set is up-to-date.  She reports allergic conjunctivitis is been moderately well-controlled with occasional red and itchy eyes for which she continues olopatadine with relief of symptoms.  She reports that she continues to experience migraines and is currently going participating in cranial sacral light touch massage therapy which is providing some relief.  She continues to follow-up with her neurologist for evaluation and treatment.  She continues to avoid large amounts of pork, chicken and beef.  She reports that she is able to tolerate small amounts of these foods, however, when consuming large amounts of these foods she experiences joint pains.  She denies  cardiopulmonary, gastrointestinal or integumentary symptoms with any foods.  Her current medications are listed in the chart.  Drug Allergies:  No Known Allergies  Physical Exam: BP 100/60   Pulse 88   Temp 98.2 F (36.8 C) (Temporal)   Resp 20   Wt 93 lb 6.4 oz (42.4 kg)   SpO2 98%   BMI 20.21 kg/m    Physical Exam Vitals reviewed.  Constitutional:      Appearance: Normal appearance.  HENT:     Head: Normocephalic and atraumatic.     Right Ear: Tympanic membrane normal.     Left Ear: Tympanic membrane normal.     Nose:     Comments: Bilateral nares edematous and pale with thin clear nasal drainage noted.  Pharynx normal.  Ears normal.  Eyes normal.    Mouth/Throat:     Pharynx: Oropharynx is clear.  Eyes:     Conjunctiva/sclera: Conjunctivae normal.  Cardiovascular:     Rate and Rhythm: Normal rate and regular rhythm.     Heart sounds: Normal heart sounds. No murmur heard. Pulmonary:     Effort: Pulmonary effort is normal.     Breath sounds: Normal breath sounds.     Comments: Lungs clear to auscultation Musculoskeletal:        General: Normal range of motion.     Cervical back: Normal range of motion and neck supple.  Skin:    General: Skin is warm and dry.  Neurological:     Mental Status: She is alert and oriented to person, place, and time.  Psychiatric:        Mood and Affect: Mood normal.  Behavior: Behavior normal.        Thought Content: Thought content normal.        Judgment: Judgment normal.     Assessment and Plan: 1. Perennial allergic rhinitis   2. Seasonal allergic conjunctivitis   3. Food intolerance   4. Nonintractable headache, unspecified chronicity pattern, unspecified headache type     Patient Instructions  Allergic rhinitis Continue allergen immunotherapy directed toward mold, dust mite and dog epinephrine autoinjector set per injection protocol Continue daily antihistamine as needed for runny nose or itch Continue Flonase  2 sprays in each nostril once a day as needed for stuffy nose Consider saline nasal rinses as needed for nasal symptoms. Use this before any medicated nasal sprays for best result  Allergic conjunctivitis Some over the counter eye drops include Pataday one drop in each eye once a day as needed for red, itchy eyes OR Zaditor one drop in each eye twice a day as needed for red itchy eyes. Avoid eye drops that say red eye relief as they may contain medications that dry out your eyes.   Headache Continue to follow-up with your neurologist If your symptoms re-occur, begin a journal of events that occurred for up to 6 hours before your symptoms began including foods and beverages consumed, soaps or perfumes you had contact with, and medications.   Food intolerance Continue to avoid foods that cause joint pain  Call the clinic if this treatment plan is not working well for you.  Follow up in 1 year or sooner if needed.   Return in about 1 year (around 10/21/2023), or if symptoms worsen or fail to improve.    Thank you for the opportunity to care for this patient.  Please do not hesitate to contact me with questions.  Thermon Leyland, FNP [facility]

## 2022-10-28 ENCOUNTER — Ambulatory Visit (INDEPENDENT_AMBULATORY_CARE_PROVIDER_SITE_OTHER): Payer: 59 | Admitting: *Deleted

## 2022-10-28 DIAGNOSIS — J309 Allergic rhinitis, unspecified: Secondary | ICD-10-CM | POA: Diagnosis not present

## 2022-11-04 ENCOUNTER — Ambulatory Visit (INDEPENDENT_AMBULATORY_CARE_PROVIDER_SITE_OTHER): Payer: 59 | Admitting: *Deleted

## 2022-11-04 DIAGNOSIS — J309 Allergic rhinitis, unspecified: Secondary | ICD-10-CM | POA: Diagnosis not present

## 2022-11-11 ENCOUNTER — Ambulatory Visit (INDEPENDENT_AMBULATORY_CARE_PROVIDER_SITE_OTHER): Payer: 59

## 2022-11-11 DIAGNOSIS — J309 Allergic rhinitis, unspecified: Secondary | ICD-10-CM | POA: Diagnosis not present

## 2022-11-13 DIAGNOSIS — J3081 Allergic rhinitis due to animal (cat) (dog) hair and dander: Secondary | ICD-10-CM | POA: Diagnosis not present

## 2022-11-13 NOTE — Progress Notes (Signed)
VIALS EXP 11-13-23 

## 2022-11-19 ENCOUNTER — Ambulatory Visit (INDEPENDENT_AMBULATORY_CARE_PROVIDER_SITE_OTHER): Payer: 59 | Admitting: *Deleted

## 2022-11-19 DIAGNOSIS — J309 Allergic rhinitis, unspecified: Secondary | ICD-10-CM

## 2022-11-25 ENCOUNTER — Ambulatory Visit (INDEPENDENT_AMBULATORY_CARE_PROVIDER_SITE_OTHER): Payer: 59 | Admitting: *Deleted

## 2022-11-25 DIAGNOSIS — J309 Allergic rhinitis, unspecified: Secondary | ICD-10-CM

## 2022-12-02 ENCOUNTER — Ambulatory Visit (INDEPENDENT_AMBULATORY_CARE_PROVIDER_SITE_OTHER): Payer: 59

## 2022-12-02 DIAGNOSIS — J309 Allergic rhinitis, unspecified: Secondary | ICD-10-CM | POA: Diagnosis not present

## 2022-12-10 ENCOUNTER — Ambulatory Visit (INDEPENDENT_AMBULATORY_CARE_PROVIDER_SITE_OTHER): Payer: 59

## 2022-12-10 DIAGNOSIS — J309 Allergic rhinitis, unspecified: Secondary | ICD-10-CM

## 2022-12-17 ENCOUNTER — Ambulatory Visit (INDEPENDENT_AMBULATORY_CARE_PROVIDER_SITE_OTHER): Payer: 59

## 2022-12-17 DIAGNOSIS — J309 Allergic rhinitis, unspecified: Secondary | ICD-10-CM | POA: Diagnosis not present

## 2022-12-24 ENCOUNTER — Ambulatory Visit (INDEPENDENT_AMBULATORY_CARE_PROVIDER_SITE_OTHER): Payer: 59 | Admitting: *Deleted

## 2022-12-24 DIAGNOSIS — J309 Allergic rhinitis, unspecified: Secondary | ICD-10-CM

## 2022-12-30 ENCOUNTER — Ambulatory Visit (INDEPENDENT_AMBULATORY_CARE_PROVIDER_SITE_OTHER): Payer: 59 | Admitting: *Deleted

## 2022-12-30 DIAGNOSIS — J309 Allergic rhinitis, unspecified: Secondary | ICD-10-CM | POA: Diagnosis not present

## 2023-01-06 ENCOUNTER — Ambulatory Visit (INDEPENDENT_AMBULATORY_CARE_PROVIDER_SITE_OTHER): Payer: Self-pay

## 2023-01-06 DIAGNOSIS — J309 Allergic rhinitis, unspecified: Secondary | ICD-10-CM | POA: Diagnosis not present

## 2023-01-14 ENCOUNTER — Ambulatory Visit (INDEPENDENT_AMBULATORY_CARE_PROVIDER_SITE_OTHER): Payer: 59 | Admitting: *Deleted

## 2023-01-14 DIAGNOSIS — J309 Allergic rhinitis, unspecified: Secondary | ICD-10-CM

## 2023-01-20 ENCOUNTER — Ambulatory Visit (INDEPENDENT_AMBULATORY_CARE_PROVIDER_SITE_OTHER): Payer: 59

## 2023-01-20 DIAGNOSIS — J309 Allergic rhinitis, unspecified: Secondary | ICD-10-CM | POA: Diagnosis not present

## 2023-01-27 ENCOUNTER — Ambulatory Visit (INDEPENDENT_AMBULATORY_CARE_PROVIDER_SITE_OTHER): Payer: 59 | Admitting: *Deleted

## 2023-01-27 DIAGNOSIS — J309 Allergic rhinitis, unspecified: Secondary | ICD-10-CM | POA: Diagnosis not present

## 2023-02-03 ENCOUNTER — Ambulatory Visit (INDEPENDENT_AMBULATORY_CARE_PROVIDER_SITE_OTHER): Payer: 59 | Admitting: *Deleted

## 2023-02-03 DIAGNOSIS — J309 Allergic rhinitis, unspecified: Secondary | ICD-10-CM

## 2023-02-06 NOTE — Progress Notes (Signed)
VIALS EXP 02-06-24

## 2023-02-07 DIAGNOSIS — J3081 Allergic rhinitis due to animal (cat) (dog) hair and dander: Secondary | ICD-10-CM | POA: Diagnosis not present

## 2023-02-11 ENCOUNTER — Ambulatory Visit (INDEPENDENT_AMBULATORY_CARE_PROVIDER_SITE_OTHER): Payer: 59 | Admitting: *Deleted

## 2023-02-11 DIAGNOSIS — J309 Allergic rhinitis, unspecified: Secondary | ICD-10-CM

## 2023-02-17 ENCOUNTER — Ambulatory Visit (INDEPENDENT_AMBULATORY_CARE_PROVIDER_SITE_OTHER): Payer: Self-pay

## 2023-02-17 DIAGNOSIS — J309 Allergic rhinitis, unspecified: Secondary | ICD-10-CM

## 2023-02-24 ENCOUNTER — Ambulatory Visit (INDEPENDENT_AMBULATORY_CARE_PROVIDER_SITE_OTHER): Payer: 59

## 2023-02-24 DIAGNOSIS — J309 Allergic rhinitis, unspecified: Secondary | ICD-10-CM | POA: Diagnosis not present

## 2023-03-04 ENCOUNTER — Ambulatory Visit (INDEPENDENT_AMBULATORY_CARE_PROVIDER_SITE_OTHER): Payer: 59 | Admitting: *Deleted

## 2023-03-04 DIAGNOSIS — J309 Allergic rhinitis, unspecified: Secondary | ICD-10-CM

## 2023-03-11 ENCOUNTER — Ambulatory Visit (INDEPENDENT_AMBULATORY_CARE_PROVIDER_SITE_OTHER): Payer: 59 | Admitting: *Deleted

## 2023-03-11 DIAGNOSIS — J309 Allergic rhinitis, unspecified: Secondary | ICD-10-CM

## 2023-03-18 ENCOUNTER — Ambulatory Visit (INDEPENDENT_AMBULATORY_CARE_PROVIDER_SITE_OTHER): Payer: Self-pay | Admitting: *Deleted

## 2023-03-18 DIAGNOSIS — J309 Allergic rhinitis, unspecified: Secondary | ICD-10-CM | POA: Diagnosis not present

## 2023-03-25 ENCOUNTER — Ambulatory Visit (INDEPENDENT_AMBULATORY_CARE_PROVIDER_SITE_OTHER): Payer: 59 | Admitting: *Deleted

## 2023-03-25 DIAGNOSIS — J309 Allergic rhinitis, unspecified: Secondary | ICD-10-CM | POA: Diagnosis not present

## 2023-04-08 ENCOUNTER — Ambulatory Visit (INDEPENDENT_AMBULATORY_CARE_PROVIDER_SITE_OTHER): Payer: 59

## 2023-04-08 DIAGNOSIS — J309 Allergic rhinitis, unspecified: Secondary | ICD-10-CM | POA: Diagnosis not present

## 2023-04-22 ENCOUNTER — Ambulatory Visit (INDEPENDENT_AMBULATORY_CARE_PROVIDER_SITE_OTHER): Payer: 59 | Admitting: *Deleted

## 2023-04-22 DIAGNOSIS — J309 Allergic rhinitis, unspecified: Secondary | ICD-10-CM | POA: Diagnosis not present

## 2023-05-20 ENCOUNTER — Ambulatory Visit (INDEPENDENT_AMBULATORY_CARE_PROVIDER_SITE_OTHER): Payer: 59 | Admitting: *Deleted

## 2023-05-20 DIAGNOSIS — J309 Allergic rhinitis, unspecified: Secondary | ICD-10-CM

## 2023-05-27 DIAGNOSIS — J3081 Allergic rhinitis due to animal (cat) (dog) hair and dander: Secondary | ICD-10-CM | POA: Diagnosis not present

## 2023-05-27 NOTE — Progress Notes (Signed)
 VIALS EXP 05-26-24

## 2023-06-17 ENCOUNTER — Ambulatory Visit (INDEPENDENT_AMBULATORY_CARE_PROVIDER_SITE_OTHER): Payer: Self-pay

## 2023-06-17 DIAGNOSIS — J309 Allergic rhinitis, unspecified: Secondary | ICD-10-CM | POA: Diagnosis not present

## 2023-07-17 ENCOUNTER — Ambulatory Visit (INDEPENDENT_AMBULATORY_CARE_PROVIDER_SITE_OTHER): Payer: Self-pay

## 2023-07-17 DIAGNOSIS — J309 Allergic rhinitis, unspecified: Secondary | ICD-10-CM | POA: Diagnosis not present

## 2023-07-17 MED ORDER — EPINEPHRINE 0.3 MG/0.3ML IJ SOAJ: 0.3000 mg | INTRAMUSCULAR | 0 refills | Status: AC | PRN

## 2023-08-12 ENCOUNTER — Ambulatory Visit (INDEPENDENT_AMBULATORY_CARE_PROVIDER_SITE_OTHER): Payer: Self-pay | Admitting: *Deleted

## 2023-08-12 DIAGNOSIS — J309 Allergic rhinitis, unspecified: Secondary | ICD-10-CM | POA: Diagnosis not present

## 2023-09-23 ENCOUNTER — Ambulatory Visit (INDEPENDENT_AMBULATORY_CARE_PROVIDER_SITE_OTHER): Payer: Self-pay

## 2023-09-23 DIAGNOSIS — J309 Allergic rhinitis, unspecified: Secondary | ICD-10-CM | POA: Diagnosis not present

## 2023-09-30 ENCOUNTER — Ambulatory Visit (INDEPENDENT_AMBULATORY_CARE_PROVIDER_SITE_OTHER)

## 2023-09-30 DIAGNOSIS — J309 Allergic rhinitis, unspecified: Secondary | ICD-10-CM

## 2023-10-07 ENCOUNTER — Ambulatory Visit (INDEPENDENT_AMBULATORY_CARE_PROVIDER_SITE_OTHER): Payer: Self-pay

## 2023-10-07 DIAGNOSIS — J309 Allergic rhinitis, unspecified: Secondary | ICD-10-CM | POA: Diagnosis not present

## 2023-10-21 ENCOUNTER — Other Ambulatory Visit: Payer: Self-pay

## 2023-10-21 ENCOUNTER — Encounter: Payer: Self-pay | Admitting: Allergy & Immunology

## 2023-10-21 ENCOUNTER — Ambulatory Visit: Admitting: Allergy & Immunology

## 2023-10-21 VITALS — BP 110/68 | HR 83 | Temp 97.6°F | Resp 18 | Ht <= 58 in | Wt 93.6 lb

## 2023-10-21 DIAGNOSIS — J3089 Other allergic rhinitis: Secondary | ICD-10-CM | POA: Diagnosis not present

## 2023-10-21 DIAGNOSIS — R519 Headache, unspecified: Secondary | ICD-10-CM

## 2023-10-21 DIAGNOSIS — K9049 Malabsorption due to intolerance, not elsewhere classified: Secondary | ICD-10-CM

## 2023-10-21 NOTE — Patient Instructions (Addendum)
 Allergic rhinitis - Continue with the nasal saline rinses as you are doing. - Continue with the allergy  shots at the same time. - Continue with cetirizine  on shot days. - Maintenance reached in April 2024. - We can continue with shots through April 2027 (three years) or April 2029 (five years). - Continue to avoid your allergy  triggers.   Headache - Everything looks fairly good today. - Headaches seem to be under good control. - Continue to avoid all of your triggers.   Food intolerance Continue to avoid foods that cause joint pain  Return in about 1 year (around 10/20/2024). You can have the follow up appointment with Dr. Idolina Maker or a Nurse Practicioner (our Nurse Practitioners are excellent and always have Physician oversight!).    Please inform us  of any Emergency Department visits, hospitalizations, or changes in symptoms. Call us  before going to the ED for breathing or allergy  symptoms since we might be able to fit you in for a sick visit. Feel free to contact us  anytime with any questions, problems, or concerns.  It was a pleasure to see you again today!  Websites that have reliable patient information: 1. American Academy of Asthma, Allergy , and Immunology: www.aaaai.org 2. Food Allergy  Research and Education (FARE): foodallergy.org 3. Mothers of Asthmatics: http://www.asthmacommunitynetwork.org 4. American College of Allergy , Asthma, and Immunology: www.acaai.org      "Like" us  on Facebook and Instagram for our latest updates!      A healthy democracy works best when [employer] participate! Make sure you are registered to vote! If you have moved or changed any of your contact information, you will need to get this updated before voting! Scan the QR codes below to learn more!

## 2023-10-21 NOTE — Progress Notes (Unsigned)
 FOLLOW UP  Date of Service/Encounter:  10/21/23   Assessment:   Perennial allergic rhinitis (indoor mold, dust mite, cat, dog) - on allergen immunotherapy with maintenance reach April 2024  Nonintractable headache   Food intolerance  Plan/Recommendations:   There are no Patient Instructions on file for this visit.   Subjective:   Theresa Garza is a 33 y.o. female presenting today for follow up of  Chief Complaint  Patient presents with   Establish Care    Theresa Garza has a history of the following: Patient Active Problem List   Diagnosis Date Noted   Food intolerance 10/21/2022   Seasonal allergic conjunctivitis 10/21/2022   Perennial allergic rhinitis 03/25/2022   Dysphagia 03/25/2022   Hemoptysis 03/25/2022   Subacute cough 03/25/2022   Subclinical hypothyroidism 04/06/2018   Esophageal reflux 01/03/2015   Nonintractable headache 01/03/2015   Constipation 01/03/2015    History obtained from: chart review and {Persons; PED relatives w/patient:19415::"patient"}.  Discussed the use of AI scribe software for clinical note transcription with the patient and/or guardian, who gave verbal consent to proceed.  Theresa Garza is a 33 y.o. female presenting for {Blank single:19197::"a food challenge","a drug challenge","skin testing","a sick visit","an evaluation of ***","a follow up visit"}.  She was last seen in June 2024 by Marinus Sic, one of our nurse practitioners.  At that time, she continue with Flonase  and nasal saline as well as allergy  shots.  Her headaches were under good control and she continues to see her neurologist.  She did have intradermal testing in November 2023.  She was positive to mold mix 4, dust mite, cat, and dog.  She did decide to start allergy  shots.  Please last visit, Asthma/Respiratory Symptom History: ***  Allergic Rhinitis Symptom History: ***  Zafirah is on allergen immunotherapy. She receives two injections. Immunotherapy script #1 contains cat and  dog. She currently receives 0.50mL of the RED vial (1/100). Immunotherapy script #2 contains molds and dust mites. She currently receives 0.50mL of the RED vial (1/100). She started shots December of 2023 and reached maintenance in April of 2024.  She sees an alternative treatment place that pursues cupping and chiropractor and lymphatic massages.   Food Allergy  Symptom History: ***  Skin Symptom History: ***  GERD Symptom History: ***  Infection Symptom History: ***  Otherwise, there have been no changes to her past medical history, surgical history, family history, or social history.    Review of systems otherwise negative other than that mentioned in the HPI.    Objective:   There were no vitals taken for this visit. There is no height or weight on file to calculate BMI.    Physical Exam   Diagnostic studies: {Blank single:19197::"none","deferred due to recent antihistamine use","deferred due to insurance stipulations that require a separate visit for testing","labs sent instead"," "}  Spirometry: {Blank single:19197::"results normal (FEV1: ***%, FVC: ***%, FEV1/FVC: ***%)","results abnormal (FEV1: ***%, FVC: ***%, FEV1/FVC: ***%)"}.    {Blank single:19197::"Spirometry consistent with mild obstructive disease","Spirometry consistent with moderate obstructive disease","Spirometry consistent with severe obstructive disease","Spirometry consistent with possible restrictive disease","Spirometry consistent with mixed obstructive and restrictive disease","Spirometry uninterpretable due to technique","Spirometry consistent with normal pattern"}. {Blank single:19197::"Albuterol /Atrovent  nebulizer","Xopenex/Atrovent  nebulizer","Albuterol  nebulizer","Albuterol  four puffs via MDI","Xopenex four puffs via MDI"} treatment given in clinic with {Blank single:19197::"significant improvement in FEV1 per ATS criteria","significant improvement in FVC per ATS criteria","significant improvement in  FEV1 and FVC per ATS criteria","improvement in FEV1, but not significant per ATS criteria","improvement in FVC, but not significant per ATS  criteria","improvement in FEV1 and FVC, but not significant per ATS criteria","no improvement"}.  Allergy  Studies: {Blank single:19197::"none","deferred due to recent antihistamine use","deferred due to insurance stipulations that require a separate visit for testing","labs sent instead"," "}    {Blank single:19197::"Allergy  testing results were read and interpreted by myself, documented by clinical staff."," "}      Drexel Gentles, MD  [facility] 

## 2023-10-22 ENCOUNTER — Encounter: Payer: Self-pay | Admitting: Allergy & Immunology

## 2023-11-04 ENCOUNTER — Ambulatory Visit (INDEPENDENT_AMBULATORY_CARE_PROVIDER_SITE_OTHER)

## 2023-11-04 DIAGNOSIS — J309 Allergic rhinitis, unspecified: Secondary | ICD-10-CM

## 2023-12-02 ENCOUNTER — Ambulatory Visit (INDEPENDENT_AMBULATORY_CARE_PROVIDER_SITE_OTHER)

## 2023-12-02 DIAGNOSIS — J309 Allergic rhinitis, unspecified: Secondary | ICD-10-CM | POA: Diagnosis not present

## 2023-12-30 ENCOUNTER — Ambulatory Visit (INDEPENDENT_AMBULATORY_CARE_PROVIDER_SITE_OTHER)

## 2023-12-30 DIAGNOSIS — J309 Allergic rhinitis, unspecified: Secondary | ICD-10-CM | POA: Diagnosis not present

## 2024-01-27 ENCOUNTER — Ambulatory Visit (INDEPENDENT_AMBULATORY_CARE_PROVIDER_SITE_OTHER)

## 2024-01-27 DIAGNOSIS — J309 Allergic rhinitis, unspecified: Secondary | ICD-10-CM

## 2024-02-24 ENCOUNTER — Ambulatory Visit

## 2024-02-24 DIAGNOSIS — J309 Allergic rhinitis, unspecified: Secondary | ICD-10-CM

## 2024-03-22 ENCOUNTER — Ambulatory Visit (INDEPENDENT_AMBULATORY_CARE_PROVIDER_SITE_OTHER)

## 2024-03-22 DIAGNOSIS — J309 Allergic rhinitis, unspecified: Secondary | ICD-10-CM

## 2024-04-19 ENCOUNTER — Ambulatory Visit

## 2024-04-19 DIAGNOSIS — J309 Allergic rhinitis, unspecified: Secondary | ICD-10-CM | POA: Diagnosis not present

## 2024-04-22 DIAGNOSIS — J302 Other seasonal allergic rhinitis: Secondary | ICD-10-CM | POA: Diagnosis not present

## 2024-04-22 DIAGNOSIS — J3089 Other allergic rhinitis: Secondary | ICD-10-CM | POA: Diagnosis not present

## 2024-04-22 DIAGNOSIS — J3081 Allergic rhinitis due to animal (cat) (dog) hair and dander: Secondary | ICD-10-CM | POA: Diagnosis not present

## 2024-04-22 NOTE — Progress Notes (Signed)
 VIALS MADE ON 04/22/24

## 2024-05-18 ENCOUNTER — Ambulatory Visit (INDEPENDENT_AMBULATORY_CARE_PROVIDER_SITE_OTHER)

## 2024-05-18 DIAGNOSIS — J302 Other seasonal allergic rhinitis: Secondary | ICD-10-CM

## 2024-05-18 DIAGNOSIS — J309 Allergic rhinitis, unspecified: Secondary | ICD-10-CM

## 2024-06-16 ENCOUNTER — Ambulatory Visit

## 2024-06-16 DIAGNOSIS — J302 Other seasonal allergic rhinitis: Secondary | ICD-10-CM

## 2024-08-25 ENCOUNTER — Ambulatory Visit: Admitting: Allergy

## 2024-10-20 ENCOUNTER — Ambulatory Visit: Admitting: Allergy
